# Patient Record
Sex: Male | Born: 1990 | Race: Black or African American | Hispanic: No | Marital: Single | Smoking: Former smoker
Health system: Southern US, Community
[De-identification: ages and names within clinical notes are randomized; demographics above are authoritative.]

## PROBLEM LIST (undated history)

## (undated) DIAGNOSIS — S51811A Laceration without foreign body of right forearm, initial encounter: Secondary | ICD-10-CM

## (undated) DIAGNOSIS — S56921A Laceration of unspecified muscles, fascia and tendons at forearm level, right arm, initial encounter: Secondary | ICD-10-CM

## (undated) DIAGNOSIS — F419 Anxiety disorder, unspecified: Secondary | ICD-10-CM

## (undated) HISTORY — PX: NO PAST SURGERIES: SHX2092

## (undated) HISTORY — PX: ANTERIOR CRUCIATE LIGAMENT REPAIR: SHX115

## (undated) HISTORY — DX: Anxiety disorder, unspecified: F41.9

## (undated) HISTORY — PX: MEDIAL COLLATERAL LIGAMENT AND LATERAL COLLATERAL LIGAMENT REPAIR, ELBOW: SHX2016

---
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
## 2014-04-22 DIAGNOSIS — S51811A Laceration without foreign body of right forearm, initial encounter: Secondary | ICD-10-CM

## 2014-04-22 HISTORY — DX: Laceration without foreign body of right forearm, initial encounter: S51.811A

## 2014-04-23 ENCOUNTER — Encounter (HOSPITAL_COMMUNITY): Payer: Self-pay | Admitting: Emergency Medicine

## 2014-04-23 ENCOUNTER — Emergency Department (HOSPITAL_COMMUNITY): Payer: BC Managed Care – PPO

## 2014-04-23 ENCOUNTER — Emergency Department (HOSPITAL_COMMUNITY)
Admission: EM | Admit: 2014-04-23 | Discharge: 2014-04-23 | Disposition: A | Discharge status: Home / Self Care | Payer: BC Managed Care – PPO | Attending: Emergency Medicine | Admitting: Emergency Medicine

## 2014-04-23 DIAGNOSIS — R4182 Altered mental status, unspecified: Secondary | ICD-10-CM | POA: Diagnosis not present

## 2014-04-23 DIAGNOSIS — S41111A Laceration without foreign body of right upper arm, initial encounter: Secondary | ICD-10-CM | POA: Diagnosis present

## 2014-04-23 DIAGNOSIS — Z880 Allergy status to penicillin: Secondary | ICD-10-CM | POA: Diagnosis not present

## 2014-04-23 DIAGNOSIS — S31010A Laceration without foreign body of lower back and pelvis without penetration into retroperitoneum, initial encounter: Secondary | ICD-10-CM | POA: Diagnosis not present

## 2014-04-23 DIAGNOSIS — Z23 Encounter for immunization: Secondary | ICD-10-CM | POA: Insufficient documentation

## 2014-04-23 DIAGNOSIS — IMO0002 Reserved for concepts with insufficient information to code with codable children: Secondary | ICD-10-CM

## 2014-04-23 DIAGNOSIS — S21111A Laceration without foreign body of right front wall of thorax without penetration into thoracic cavity, initial encounter: Secondary | ICD-10-CM | POA: Insufficient documentation

## 2014-04-23 DIAGNOSIS — Y9389 Activity, other specified: Secondary | ICD-10-CM | POA: Diagnosis not present

## 2014-04-23 DIAGNOSIS — Y929 Unspecified place or not applicable: Secondary | ICD-10-CM | POA: Insufficient documentation

## 2014-04-23 DIAGNOSIS — Y280XXA Contact with sharp glass, undetermined intent, initial encounter: Secondary | ICD-10-CM | POA: Diagnosis not present

## 2014-04-23 DIAGNOSIS — Z720 Tobacco use: Secondary | ICD-10-CM | POA: Insufficient documentation

## 2014-04-23 DIAGNOSIS — T148XXA Other injury of unspecified body region, initial encounter: Secondary | ICD-10-CM

## 2014-04-23 DIAGNOSIS — T07XXXA Unspecified multiple injuries, initial encounter: Secondary | ICD-10-CM

## 2014-04-23 LAB — ETHANOL: Alcohol, Ethyl (B): 63 mg/dL — ABNORMAL HIGH (ref 0–11)

## 2014-04-23 MED ORDER — HYDROCODONE-ACETAMINOPHEN 5-325 MG PO TABS: 1.0000 | ORAL_TABLET | ORAL | Status: DC | PRN

## 2014-04-23 MED ORDER — TETANUS-DIPHTH-ACELL PERTUSSIS 5-2.5-18.5 LF-MCG/0.5 IM SUSP
0.5000 mL | Freq: Once | INTRAMUSCULAR | Status: AC
  Administered 2014-04-23: 0.5 mL via INTRAMUSCULAR
  Filled 2014-04-23: qty 0.5

## 2014-04-23 MED ORDER — SULFAMETHOXAZOLE-TRIMETHOPRIM 800-160 MG PO TABS: 1.0000 | ORAL_TABLET | Freq: Two times a day (BID) | ORAL | Status: AC

## 2014-04-23 MED ORDER — LIDOCAINE-EPINEPHRINE (PF) 2 %-1:200000 IJ SOLN
20.0000 mL | Freq: Once | INTRAMUSCULAR | Status: AC
  Administered 2014-04-23: 20 mL
  Filled 2014-04-23: qty 10

## 2014-04-23 NOTE — ED Notes (Signed)
Irving BurtonEmily, PA at bedside suturing three lacerations.

## 2014-04-23 NOTE — ED Provider Notes (Signed)
Medical screening examination/treatment/procedure(s) were performed by non-physician practitioner and as supervising physician I was immediately available for consultation/collaboration.   Saleema Weppler, MD 04/23/14 0721 

## 2014-04-23 NOTE — ED Provider Notes (Signed)
Patient is a 23 y.o. Male who presents to the ED for lacerations and altered mental status.  Patient likely went through a window and obtained large lacerations to the arm and chest.  Patient was signed out to me at shift change by Trixie DredgeEmily West PA-C.  Patient was to ambulate with a steady gait and to be interviewed for SI.    8:55 am Patient was up and ambulating with a steady gait and was asking to go home.  Patient was able to have a coherent conversation with me and the nursing staff.  Patient does not remember what happened to him.  He reports drinking and taking xanax and possibly smoking marijuana last night.  Patient denies any suicidal ideations or plan.  Patient is stable for discharge at this time.  He is going to find a ride home. Patient to follow-up with Dr. Merrilee SeashoreKuzma's office today.  Patient to return for signs of infection.  Patient states understanding and agreement.  Patient is stable for discharge.    Eben Burowourtney A Forcucci, PA-C 04/23/14 [phone]

## 2014-04-23 NOTE — Discharge Instructions (Signed)
Read the information below.  Use the prescribed medication as directed.  Please discuss all new medications with your pharmacist.  Do not take additional tylenol while taking the prescribed pain medication to avoid overdose.  You may return to the Emergency Department at any time for worsening condition or any new symptoms that concern you.  If you develop redness, swelling, pus draining from the wound, uncontrolled bleeding, or fevers greater than 100.4, return to the ER immediately for a recheck.    It is very important that you call Dr Merrilee SeashoreKuzma's office today to schedule a close follow up appointment.  You are unable to fully extend your ring and pinky fingers on the right and this will likely need to be repaired.   Laceration Care, Adult A laceration is a cut that goes through all layers of the skin. The cut goes into the tissue beneath the skin. HOME CARE For stitches (sutures) or staples:  Keep the cut clean and dry.  If you have a bandage (dressing), change it at least once a day. Change the bandage if it gets wet or dirty, or as told by your doctor.  Wash the cut with soap and water 2 times a day. Rinse the cut with water. Pat it dry with a clean towel.  Put a thin layer of medicated cream on the cut as told by your doctor.  You may shower after the first 24 hours. Do not soak the cut in water until the stitches are removed.  Only take medicines as told by your doctor.  Have your stitches or staples removed as told by your doctor. For skin adhesive strips:  Keep the cut clean and dry.  Do not get the strips wet. You may take a bath, but be careful to keep the cut dry.  If the cut gets wet, pat it dry with a clean towel.  The strips will fall off on their own. Do not remove the strips that are still stuck to the cut. For wound glue:  You may shower or take baths. Do not soak or scrub the cut. Do not swim. Avoid heavy sweating until the glue falls off on its own. After a shower  or bath, pat the cut dry with a clean towel.  Do not put medicine on your cut until the glue falls off.  If you have a bandage, do not put tape over the glue.  Avoid lots of sunlight or tanning lamps until the glue falls off. Put sunscreen on the cut for the first year to reduce your scar.  The glue will fall off on its own. Do not pick at the glue. You may need a tetanus shot if:  You cannot remember when you had your last tetanus shot.  You have never had a tetanus shot. If you need a tetanus shot and you choose not to have one, you may get tetanus. Sickness from tetanus can be serious. GET HELP RIGHT AWAY IF:   Your pain does not get better with medicine.  Your arm, hand, leg, or foot loses feeling (numbness) or changes color.  Your cut is bleeding.  Your joint feels weak, or you cannot use your joint.  You have painful lumps on your body.  Your cut is red, puffy (swollen), or painful.  You have a red line on the skin near the cut.  You have yellowish-white fluid (pus) coming from the cut.  You have a fever.  You have a bad smell coming from  the cut or bandage.  Your cut breaks open before or after stitches are removed.  You notice something coming out of the cut, such as wood or glass.  You cannot move a finger or toe. MAKE SURE YOU:   Understand these instructions.  Will watch your condition.  Will get help right away if you are not doing well or get worse. Document Released: 12/05/2007 Document Revised: 09/10/2011 Document Reviewed: 12/12/2010 [facility] Patient Information 2015 [employer] [place]. This information is not intended to replace advice given to you by your health care provider. Make sure you discuss any questions you have with your health care provider.

## 2014-04-23 NOTE — ED Notes (Signed)
Right radial pulse present.  Patient is able to move right hand without any difficulty.

## 2014-04-23 NOTE — ED Notes (Signed)
Patient transported to X-ray 

## 2014-04-23 NOTE — ED Provider Notes (Signed)
Medical screening examination/treatment/procedure(s) were performed by non-physician practitioner and as supervising physician I was immediately available for consultation/collaboration.   EKG Interpretation None        Megan Docherty, MD 04/23/14 1644 

## 2014-04-23 NOTE — ED Notes (Signed)
[name] BurtonEmily, PA at bedside to suture large laceration.  Patient drowsy, tolerated procedure well.

## 2014-04-23 NOTE — ED Provider Notes (Signed)
CSN: [id number]     Arrival date & time 04/23/14  7829 History   First MD Initiated Contact with Patient 04/23/14 [phone]     Chief Complaint  Patient presents with  . Extremity Laceration     (Consider location/radiation/quality/duration/timing/severity/associated sxs/prior Treatment) The history is provided by the patient and a significant other.    Pt reports he does not know exactly what happened, cut his right arm on glass.  Tells me it was on a car door window.  Denies SI or attempt to self harm.  Significant other states they had talked earlier and she was going to come over to his house after going out, while out he told her that he "fell" and was "bleeding a little."  When she arrived he was beside a broken house window.  Pt states he drank 2 16 oz beers tonight.  Denies weakness or numbness of his right hand, denies other injury.    Level V caveat for intoxication.   History reviewed. No pertinent past medical history. History reviewed. No pertinent past surgical history. History reviewed. No pertinent family history. History  Substance Use Topics  . Smoking status: Current Every Day Smoker  . Smokeless tobacco: Not on file  . Alcohol Use: Yes    Review of Systems  Unable to perform ROS: Mental status change      Allergies  Penicillins and Shrimp  Home Medications   Prior to Admission medications   Not on File   BP 117/62  Pulse 82  Temp(Src) 97.7 F (36.5 C) (Oral)  Resp 16  Ht 5\' 10"  (1.778 m)  Wt 165 lb (74.844 kg)  BMI 23.68 kg/m2  SpO2 98% Physical Exam  Nursing note and vitals reviewed. Constitutional: He appears well-developed and well-nourished. He is easily aroused. No distress.  Drowsy but wakes up to respond to questions.  Appears intoxicated.   HENT:  Head: Normocephalic and atraumatic.  Neck: Neck supple.  Pulmonary/Chest: Effort normal.  Musculoskeletal:       Arms: Right hand with intact distal sensation, capillary refill < 2 seconds.   Full flexion of all digits.  Able to move and partial extend but decreased ability to full extend 4th and 5th fingers.    Neurological: He is alert and easily aroused.  Skin: He is not diaphoretic.    ED Course  Procedures (including critical care time) Labs Review Labs Reviewed  ETHANOL - Abnormal; Notable for the following:    Alcohol, Ethyl (B) 63 (*)    All other components within normal limits    Imaging Review Dg Forearm Right  04/23/2014   CLINICAL DATA:  Patient fell through glass window with a 4 inch laceration on the anterior forearm.  EXAM: RIGHT FOREARM - 2 VIEW  COMPARISON:  None.  FINDINGS: Soft tissue injury to the dorsal aspect of the right forearm with skin defect consistent with history of lacerations. Gauze material is present which may obscure detail but there appears to be a tiny radiopaque foreign body in the soft tissues over the midshaft of the radius, measuring about 5 mm deep to the skin surface. No evidence of acute fracture or dislocation in the radius or ulna.  IMPRESSION: Soft tissue injury to the dorsal mid right forearm with tiny foreign body demonstrated.   Electronically Signed   By: Burman Nieves M.D.   On: 04/23/2014 04:59   Ct Head Wo Contrast  04/23/2014   CLINICAL DATA:  Initial evaluation for acute altered mental status.  EXAM: CT HEAD WITHOUT CONTRAST  TECHNIQUE: Contiguous axial images were obtained from the base of the skull through the vertex without intravenous contrast.  COMPARISON:  None.  FINDINGS: There is no acute intracranial hemorrhage or infarct. No mass lesion or midline shift. Gray-white matter differentiation is well maintained. Ventricles are normal in size without evidence of hydrocephalus. CSF containing spaces are within normal limits. No extra-axial fluid collection.  The calvarium is intact.  Orbital soft tissues are within normal limits.  Mild polypoid mucosal thickening present within the partially visualized maxillary sinuses,  left greater than right. Scattered mucoperiosteal thickening also present within the ethmoidal air cells. No mastoid effusion.  Scalp soft tissues are unremarkable.  IMPRESSION: No acute intracranial process.   Electronically Signed   By: Rise MuBenjamin  McClintock M.D.   On: 04/23/2014 05:16     EKG Interpretation None      5:51 AM Discussed patient with Dr Merlyn LotKuzma.  Recommends wash out and close skin, d/c on bactrim vs doxycyline with follow up in the office.  Pt to call office today for close follow up appointment early next week.    LACERATION REPAIR Performed by: Trixie DredgeWEST, Keiona Jenison Authorized by: Trixie DredgeWEST, Niya Behler Consent: Verbal consent obtained. Risks and benefits: risks, benefits and alternatives were discussed Consent given by: patient Patient identity confirmed: provided demographic data Prepped and Draped in normal sterile fashion Wound explored  Laceration Location: right upper arm  Laceration Length: 2cm  No Foreign Bodies seen or palpated  Anesthesia: local infiltration  Local anesthetic: lidocaine 2% with epinephrine  Anesthetic total: 1 ml  Irrigation method: syringe Amount of cleaning: standard  Skin closure: 4-0 prolene  Number of sutures: 3  Technique: simple interrupted  Patient tolerance: Patient tolerated the procedure well with no immediate complications.   LACERATION REPAIR Performed by: Trixie DredgeWEST, Atlas Crossland Authorized by: Trixie DredgeWEST, Taveon Enyeart Consent: Verbal consent obtained. Risks and benefits: risks, benefits and alternatives were discussed Consent given by: patient Patient identity confirmed: provided demographic data Prepped and Draped in normal sterile fashion Wound explored  Laceration Location: right chest wall  Laceration Length: 7cm  No Foreign Bodies seen or palpated  Anesthesia: local infiltration  Local anesthetic: lidocaine 2% with epinephrine  Anesthetic total: 3 ml  Irrigation method: syringe Amount of cleaning: standard  Skin closure: 4-0  prolene  Number of sutures: 6  Technique: simple interrupted  Patient tolerance: Patient tolerated the procedure well with no immediate complications.   LACERATION REPAIR Performed by: Trixie DredgeWEST, Marbeth Smedley Authorized by: Trixie DredgeWEST, Sakeenah Valcarcel Consent: Verbal consent obtained. Risks and benefits: risks, benefits and alternatives were discussed Consent given by: patient Patient identity confirmed: provided demographic data Prepped and Draped in normal sterile fashion Wound explored  Laceration Location: left upper back  Laceration Length: 1.5cm  No Foreign Bodies seen or palpated  Anesthesia: local infiltration  Local anesthetic: lidocaine 2% with epinephrine  Anesthetic total: 1 ml  Irrigation method: syringe Amount of cleaning: standard  Skin closure: 4-0 prolene  Number of sutures: 2  Technique: simple interrupted  Patient tolerance: Patient tolerated the procedure well with no immediate complications.   LACERATION REPAIR Performed by: Trixie DredgeWEST, Ercel Pepitone Authorized by: Trixie DredgeWEST, Midge Momon Consent: Verbal consent obtained. Risks and benefits: risks, benefits and alternatives were discussed Consent given by: patient Patient identity confirmed: provided demographic data Prepped and Draped in normal sterile fashion Wound explored  Laceration Location: right forearm, dorsal  Laceration Length: 10cm  No Foreign Bodies seen or palpated  Anesthesia: local infiltration  Local anesthetic: lidocaine 2% with epinephrine  Anesthetic total: 4 ml  Irrigation method: syringe Amount of cleaning: extensive  Skin closure: 4-0 prolene  Number of sutures: 5  Technique: horizontal mattress  Patient tolerance: Patient tolerated the procedure well with no immediate complications.   MDM   Final diagnoses:  Altered mental status  Laceration  Multiple lacerations  Muscle laceration   Pt with multiple simple lacerations to right chest wall and right upper arm and large deep laceration to  proximal forearm.  Pt with decreased strength and ability to extend 4th and 5th digits on right hand.  Pt appearing intoxicated and sleepy and unable to tell me how injury happened, therefore ordered CT head negative.  Extremity film shows possible foreign body.  ETOH 63. Discussed pt with hand surgery, please see discussion above.   7:05 AM Patient signed out to [facility]ney Forcucci, PA-C at change of shift.  Pt will need to sober somewhat before leaving and become clear enough to ensure that he did not do this purposely and that there are no other injuries.      Harpers FerryEmily Thurmon Mizell, PA-C 04/23/14 [phone]

## 2014-04-23 NOTE — ED Notes (Signed)
Pt has a laceration noted to his right forearm about 4 inches in length with muscle protruding  Pt states he is unsure how it happened but he thinks he fell into his window  Pt states he has been drinking tonight  Pt is able to move all his fingers on his right hand

## 2014-04-26 ENCOUNTER — Emergency Department (HOSPITAL_COMMUNITY)
Admission: EM | Admit: 2014-04-26 | Discharge: 2014-04-26 | Disposition: A | Discharge status: Home / Self Care | Payer: BC Managed Care – PPO | Attending: Emergency Medicine | Admitting: Emergency Medicine

## 2014-04-26 ENCOUNTER — Encounter (HOSPITAL_COMMUNITY): Payer: Self-pay | Admitting: Emergency Medicine

## 2014-04-26 DIAGNOSIS — Z4801 Encounter for change or removal of surgical wound dressing: Secondary | ICD-10-CM | POA: Diagnosis not present

## 2014-04-26 DIAGNOSIS — Z880 Allergy status to penicillin: Secondary | ICD-10-CM | POA: Insufficient documentation

## 2014-04-26 DIAGNOSIS — Z792 Long term (current) use of antibiotics: Secondary | ICD-10-CM | POA: Diagnosis not present

## 2014-04-26 DIAGNOSIS — R531 Weakness: Secondary | ICD-10-CM | POA: Diagnosis not present

## 2014-04-26 DIAGNOSIS — Z5189 Encounter for other specified aftercare: Secondary | ICD-10-CM

## 2014-04-26 DIAGNOSIS — Z720 Tobacco use: Secondary | ICD-10-CM | POA: Diagnosis not present

## 2014-04-26 NOTE — Discharge Instructions (Signed)
Read the information below.  You may return to the Emergency Department at any time for worsening condition or any new symptoms that concern you.  It is very important that you follow up with the hand surgeon listed above.  Call his office today for an appointment.  This is because your wound has made you unable to fully extend your 4th and 5th fingers.  If you develop redness, swelling, pus draining from the wound, or fevers greater than 100.4, return to the ER immediately for a recheck.

## 2014-04-26 NOTE — ED Provider Notes (Signed)
Medical screening examination/treatment/procedure(s) were performed by non-physician practitioner and as supervising physician I was immediately available for consultation/collaboration.   EKG Interpretation None       Katalyna Socarras, MD 04/26/14 1731 

## 2014-04-26 NOTE — ED Notes (Signed)
Pt has multiple lacerations from 10/23. Sutures intact to right forearm, right chest and left back.

## 2014-04-26 NOTE — ED Notes (Signed)
Pt reports he needs sutures removed from arm; has not taken original bandage off arm

## 2014-04-26 NOTE — ED Provider Notes (Signed)
CSN: [id number]     Arrival date & time 04/26/14  1042 History  This chart was scribed for non-physician practitioner working with Doug SouSam Jacubowitz, MD by Leone PayorSonum Patel, ED Scribe. This patient was seen in room TR07C/TR07C and the patient's care was started at 12:23 PM.    Chief Complaint  Patient presents with  . Suture / Staple Removal    The history is provided by the patient. No language interpreter was used.    HPI Comments: [name] [name] is a 23 y.o. male who presents to the Emergency Department requesting wound check today. Patient was seen on 04/23/14 for lacerations. The mechanism of injury was not clear at that time due patient intoxication. Patient states he is still not able to recall the events of that night. He states he has not followed up with hand surgeon but is taking the prescribed Bactrim. He has been keeping the affected areas covered. He denies associated fever, drainage, discharge. He denies current pain.   History reviewed. No pertinent past medical history. History reviewed. No pertinent past surgical history. History reviewed. No pertinent family history. History  Substance Use Topics  . Smoking status: Current Every Day Smoker  . Smokeless tobacco: Not on file  . Alcohol Use: Yes    Review of Systems  Constitutional: Negative for fever.  Skin: Positive for wound.  Neurological: Positive for weakness.    Allergies  Penicillins and Shrimp  Home Medications   Prior to Admission medications   Medication Sig Start Date End Date Taking? Authorizing Provider  HYDROcodone-acetaminophen (NORCO/VICODIN) 5-325 MG per tablet Take 1-2 tablets by mouth every 4 (four) hours as needed for moderate pain or severe pain. 04/23/14   Trixie DredgeEmily Charliegh Vasudevan, PA-C  sulfamethoxazole-trimethoprim (BACTRIM DS,SEPTRA DS) 800-160 MG per tablet Take 1 tablet by mouth 2 (two) times daily. 04/23/14 04/30/14  Trixie DredgeEmily Raymona Boss, PA-C   BP 137/84  Pulse 91  Temp(Src) 97.5 F (36.4 C) (Oral)  Resp 20   Ht 5\' 10"  (1.778 m)  Wt 165 lb (74.844 kg)  BMI 23.68 kg/m2  SpO2 100% Physical Exam  Nursing note and vitals reviewed. Constitutional: He appears well-developed and well-nourished. No distress.  HENT:  Head: Normocephalic and atraumatic.  Neck: Neck supple.  Pulmonary/Chest: Effort normal.  Neurological: He is alert.  Skin: He is not diaphoretic.  Sutures in place over left upper back, right chest wall, right upper arm; no erythema, edema, warmth, tenderness, discharge. Right proximal forearm with sutures intact; no erythema, edema, warmth, tenderness, discharge. Distal sensation and pulses intact. Continued weakness with extension of 4th and 5th digits.     ED Course  Procedures (including critical care time)  DIAGNOSTIC STUDIES: Oxygen Saturation is 100% on RA, normal by my interpretation.    COORDINATION OF CARE: 12:29 PM Discussed treatment plan with pt at bedside and pt agreed to plan.   Labs Review Labs Reviewed - No data to display  Imaging Review No results found.   EKG Interpretation None      MDM   Final diagnoses:  None   Afebrile, nontoxic patient with multiple lacerations from incident he does not remember 10/23.  I saw him at that time as well.  Most of the lacerations were small and uncomplicated, are currently healing well.  Right forearm laceration was large and gaping, complex, with concern as pt is unable to extend 4th and 5th fingers.  I spoke with Dr Merlyn LotKuzma at the time of the accident.  I have encouraged patient to continue his  antibiotics and follow up with Dr Merlyn LotKuzma.   No e/o wound infection.  D/C home with wound care instructions, hand follow up.  Discussed result, findings, treatment, and follow up  with patient.  Pt given return precautions.  Pt verbalizes understanding and agrees with plan.       I personally performed the services described in this documentation, which was scribed in my presence. The recorded information has been reviewed and is  accurate.   Trixie Dredgemily Shuntay Everetts, PA-C 04/26/14 1336

## 2014-04-30 ENCOUNTER — Other Ambulatory Visit: Payer: Self-pay | Admitting: Orthopedic Surgery

## 2014-05-03 ENCOUNTER — Encounter (HOSPITAL_BASED_OUTPATIENT_CLINIC_OR_DEPARTMENT_OTHER): Payer: Self-pay | Admitting: *Deleted

## 2014-05-04 ENCOUNTER — Ambulatory Visit (HOSPITAL_BASED_OUTPATIENT_CLINIC_OR_DEPARTMENT_OTHER)
Admission: RE | Admit: 2014-05-04 | Discharge: 2014-05-04 | Disposition: A | Discharge status: Home / Self Care | Payer: BC Managed Care – PPO | Source: Ambulatory Visit | Attending: Orthopedic Surgery | Admitting: Orthopedic Surgery

## 2014-05-04 ENCOUNTER — Encounter (HOSPITAL_BASED_OUTPATIENT_CLINIC_OR_DEPARTMENT_OTHER): Payer: Self-pay | Admitting: Anesthesiology

## 2014-05-04 ENCOUNTER — Ambulatory Visit (HOSPITAL_BASED_OUTPATIENT_CLINIC_OR_DEPARTMENT_OTHER): Payer: BC Managed Care – PPO | Admitting: Anesthesiology

## 2014-05-04 ENCOUNTER — Encounter (HOSPITAL_BASED_OUTPATIENT_CLINIC_OR_DEPARTMENT_OTHER)
Admission: RE | Disposition: A | Discharge status: Home / Self Care | Payer: Self-pay | Source: Ambulatory Visit | Attending: Orthopedic Surgery

## 2014-05-04 DIAGNOSIS — Z880 Allergy status to penicillin: Secondary | ICD-10-CM | POA: Diagnosis not present

## 2014-05-04 DIAGNOSIS — Y929 Unspecified place or not applicable: Secondary | ICD-10-CM | POA: Diagnosis not present

## 2014-05-04 DIAGNOSIS — X58XXXA Exposure to other specified factors, initial encounter: Secondary | ICD-10-CM | POA: Insufficient documentation

## 2014-05-04 DIAGNOSIS — S5411XA Injury of median nerve at forearm level, right arm, initial encounter: Secondary | ICD-10-CM | POA: Diagnosis not present

## 2014-05-04 DIAGNOSIS — Z91013 Allergy to seafood: Secondary | ICD-10-CM | POA: Diagnosis not present

## 2014-05-04 DIAGNOSIS — F17210 Nicotine dependence, cigarettes, uncomplicated: Secondary | ICD-10-CM | POA: Insufficient documentation

## 2014-05-04 HISTORY — DX: Laceration without foreign body of right forearm, initial encounter: S51.811A

## 2014-05-04 HISTORY — PX: NERVE REPAIR: SHX2083

## 2014-05-04 HISTORY — PX: WOUND EXPLORATION: SHX6188

## 2014-05-04 HISTORY — DX: Laceration of unspecified muscles, fascia and tendons at forearm level, right arm, initial encounter: S56.921A

## 2014-05-04 SURGERY — WOUND EXPLORATION
Anesthesia: Regional | Site: Arm Lower | Laterality: Right

## 2014-05-04 MED ORDER — FENTANYL CITRATE 0.05 MG/ML IJ SOLN
INTRAMUSCULAR | Status: AC
  Filled 2014-05-04: qty 4

## 2014-05-04 MED ORDER — OXYCODONE HCL 5 MG PO TABS: 5.0000 mg | ORAL_TABLET | Freq: Once | ORAL | Status: DC | PRN

## 2014-05-04 MED ORDER — FENTANYL CITRATE 0.05 MG/ML IJ SOLN
INTRAMUSCULAR | Status: AC
  Filled 2014-05-04: qty 2

## 2014-05-04 MED ORDER — MIDAZOLAM HCL 2 MG/2ML IJ SOLN
1.0000 mg | INTRAMUSCULAR | Status: DC | PRN
  Administered 2014-05-04: 2 mg via INTRAVENOUS

## 2014-05-04 MED ORDER — HYDROCODONE-ACETAMINOPHEN 5-325 MG PO TABS: ORAL_TABLET | ORAL | Status: DC

## 2014-05-04 MED ORDER — MIDAZOLAM HCL 2 MG/2ML IJ SOLN
INTRAMUSCULAR | Status: AC
  Filled 2014-05-04: qty 2

## 2014-05-04 MED ORDER — ONDANSETRON HCL 4 MG/2ML IJ SOLN: 4.0000 mg | Freq: Four times a day (QID) | INTRAMUSCULAR | Status: DC | PRN

## 2014-05-04 MED ORDER — CHLORHEXIDINE GLUCONATE 4 % EX LIQD: 60.0000 mL | Freq: Once | CUTANEOUS | Status: DC

## 2014-05-04 MED ORDER — OXYCODONE HCL 5 MG/5ML PO SOLN: 5.0000 mg | Freq: Once | ORAL | Status: DC | PRN

## 2014-05-04 MED ORDER — ONDANSETRON HCL 4 MG/2ML IJ SOLN
INTRAMUSCULAR | Status: DC | PRN
  Administered 2014-05-04: 4 mg via INTRAVENOUS

## 2014-05-04 MED ORDER — VANCOMYCIN HCL IN DEXTROSE 1-5 GM/200ML-% IV SOLN
INTRAVENOUS | Status: AC
  Filled 2014-05-04: qty 200

## 2014-05-04 MED ORDER — LACTATED RINGERS IV SOLN
INTRAVENOUS | Status: DC | PRN
  Administered 2014-05-04 (×2): via INTRAVENOUS

## 2014-05-04 MED ORDER — LACTATED RINGERS IV SOLN
INTRAVENOUS | Status: DC
  Administered 2014-05-04: 14:00:00 via INTRAVENOUS

## 2014-05-04 MED ORDER — LIDOCAINE HCL (CARDIAC) 20 MG/ML IV SOLN
INTRAVENOUS | Status: DC | PRN
  Administered 2014-05-04: 100 mg via INTRAVENOUS

## 2014-05-04 MED ORDER — DEXAMETHASONE SODIUM PHOSPHATE 4 MG/ML IJ SOLN
INTRAMUSCULAR | Status: DC | PRN
  Administered 2014-05-04: 10 mg via INTRAVENOUS

## 2014-05-04 MED ORDER — PROPOFOL 10 MG/ML IV BOLUS
INTRAVENOUS | Status: DC | PRN
  Administered 2014-05-04: 200 mg via INTRAVENOUS

## 2014-05-04 MED ORDER — MIDAZOLAM HCL 2 MG/ML PO SYRP: 12.0000 mg | ORAL_SOLUTION | Freq: Once | ORAL | Status: DC | PRN

## 2014-05-04 MED ORDER — HYDROMORPHONE HCL 1 MG/ML IJ SOLN: 0.2500 mg | INTRAMUSCULAR | Status: DC | PRN

## 2014-05-04 MED ORDER — BUPIVACAINE-EPINEPHRINE (PF) 0.5% -1:200000 IJ SOLN
INTRAMUSCULAR | Status: DC | PRN
  Administered 2014-05-04: 30 mL via PERINEURAL

## 2014-05-04 MED ORDER — FENTANYL CITRATE 0.05 MG/ML IJ SOLN
50.0000 ug | INTRAMUSCULAR | Status: DC | PRN
  Administered 2014-05-04: 100 ug via INTRAVENOUS

## 2014-05-04 MED ORDER — VANCOMYCIN HCL IN DEXTROSE 1-5 GM/200ML-% IV SOLN
1000.0000 mg | INTRAVENOUS | Status: AC
  Administered 2014-05-04: 1000 mg via INTRAVENOUS

## 2014-05-04 SURGICAL SUPPLY — 68 items
BAG DECANTER FOR FLEXI CONT (MISCELLANEOUS) IMPLANT
BANDAGE ELASTIC 3 VELCRO ST LF (GAUZE/BANDAGES/DRESSINGS) ×4 IMPLANT
BLADE MINI RND TIP GREEN BEAV (BLADE) IMPLANT
BLADE SURG 15 STRL LF DISP TIS (BLADE) ×4 IMPLANT
BLADE SURG 15 STRL SS (BLADE) ×8
BNDG CMPR 9X4 STRL LF SNTH (GAUZE/BANDAGES/DRESSINGS) ×2
BNDG ESMARK 4X9 LF (GAUZE/BANDAGES/DRESSINGS) ×3 IMPLANT
BNDG GAUZE ELAST 4 BULKY (GAUZE/BANDAGES/DRESSINGS) ×4 IMPLANT
CHLORAPREP W/TINT 26ML (MISCELLANEOUS) ×1 IMPLANT
CORDS BIPOLAR (ELECTRODE) ×4 IMPLANT
COVER BACK TABLE 60X90IN (DRAPES) ×4 IMPLANT
COVER MAYO STAND STRL (DRAPES) ×4 IMPLANT
CUFF TOURNIQUET SINGLE 18IN (TOURNIQUET CUFF) IMPLANT
DECANTER SPIKE VIAL GLASS SM (MISCELLANEOUS) ×4 IMPLANT
DRAPE EXTREMITY T 121X128X90 (DRAPE) ×4 IMPLANT
DRAPE SURG 17X23 STRL (DRAPES) ×4 IMPLANT
GAUZE SPONGE 4X4 12PLY STRL (GAUZE/BANDAGES/DRESSINGS) ×4 IMPLANT
GAUZE XEROFORM 1X8 LF (GAUZE/BANDAGES/DRESSINGS) ×4 IMPLANT
GLOVE BIO SURGEON STRL SZ 6.5 (GLOVE) ×2 IMPLANT
GLOVE BIO SURGEON STRL SZ7.5 (GLOVE) ×4 IMPLANT
GLOVE BIO SURGEONS STRL SZ 6.5 (GLOVE) ×1
GLOVE BIOGEL PI IND STRL 7.0 (GLOVE) ×1 IMPLANT
GLOVE BIOGEL PI IND STRL 8 (GLOVE) ×2 IMPLANT
GLOVE BIOGEL PI IND STRL 8.5 (GLOVE) ×1 IMPLANT
GLOVE BIOGEL PI INDICATOR 7.0 (GLOVE) ×2
GLOVE BIOGEL PI INDICATOR 8 (GLOVE) ×2
GLOVE BIOGEL PI INDICATOR 8.5 (GLOVE) ×2
GLOVE SURG ORTHO 8.0 STRL STRW (GLOVE) ×3 IMPLANT
GOWN STRL REUS W/ TWL LRG LVL3 (GOWN DISPOSABLE) ×2 IMPLANT
GOWN STRL REUS W/TWL LRG LVL3 (GOWN DISPOSABLE) ×4
GOWN STRL REUS W/TWL XL LVL3 (GOWN DISPOSABLE) ×7 IMPLANT
LOCATOR NERVE 3 VOLT (DISPOSABLE) ×3 IMPLANT
LOOP VESSEL MAXI BLUE (MISCELLANEOUS) IMPLANT
NDL HYPO 25X1 1.5 SAFETY (NEEDLE) IMPLANT
NDL SAFETY ECLIPSE 18X1.5 (NEEDLE) IMPLANT
NEEDLE HYPO 18GX1.5 SHARP (NEEDLE)
NEEDLE HYPO 25X1 1.5 SAFETY (NEEDLE) IMPLANT
NS IRRIG 1000ML POUR BTL (IV SOLUTION) ×4 IMPLANT
PACK BASIN DAY SURGERY FS (CUSTOM PROCEDURE TRAY) ×4 IMPLANT
PAD CAST 3X4 CTTN HI CHSV (CAST SUPPLIES) ×2 IMPLANT
PAD CAST 4YDX4 CTTN HI CHSV (CAST SUPPLIES) ×1 IMPLANT
PADDING CAST ABS 4INX4YD NS (CAST SUPPLIES)
PADDING CAST ABS COTTON 4X4 ST (CAST SUPPLIES) ×1 IMPLANT
PADDING CAST COTTON 3X4 STRL (CAST SUPPLIES) ×4
PADDING CAST COTTON 4X4 STRL (CAST SUPPLIES) ×4
SLEEVE SCD COMPRESS KNEE MED (MISCELLANEOUS) ×3 IMPLANT
SLING ARM IMMOBILIZER LRG (SOFTGOODS) ×3 IMPLANT
SPEAR EYE SURG WECK-CEL (MISCELLANEOUS) ×4 IMPLANT
SPLINT PLASTER CAST XFAST 3X15 (CAST SUPPLIES) ×10 IMPLANT
SPLINT PLASTER XTRA FASTSET 3X (CAST SUPPLIES) ×20
STOCKINETTE 4X48 STRL (DRAPES) ×4 IMPLANT
SUT ETHIBOND 3-0 V-5 (SUTURE) IMPLANT
SUT ETHILON 3 0 PS 1 (SUTURE) ×3 IMPLANT
SUT ETHILON 4 0 PS 2 18 (SUTURE) ×4 IMPLANT
SUT ETHILON 8 0 BV130 4 (SUTURE) ×3 IMPLANT
SUT FIBERWIRE 4-0 18 TAPR NDL (SUTURE)
SUT MERSILENE 6 0 P 1 (SUTURE) IMPLANT
SUT NYLON 9 0 VRM6 (SUTURE) IMPLANT
SUT PROLENE 6 0 P 1 18 (SUTURE) IMPLANT
SUT SILK 4 0 PS 2 (SUTURE) IMPLANT
SUT SUPRAMID 4-0 (SUTURE) IMPLANT
SUT VICRYL 3-0 RB1 (SUTURE) ×3 IMPLANT
SUT VICRYL 4-0 PS2 18IN ABS (SUTURE) IMPLANT
SUTURE FIBERWR 4-0 18 TAPR NDL (SUTURE) IMPLANT
SYR BULB 3OZ (MISCELLANEOUS) ×4 IMPLANT
SYR CONTROL 10ML LL (SYRINGE) IMPLANT
TOWEL OR 17X24 6PK STRL BLUE (TOWEL DISPOSABLE) ×8 IMPLANT
UNDERPAD 30X30 INCONTINENT (UNDERPADS AND DIAPERS) ×4 IMPLANT

## 2014-05-04 NOTE — Anesthesia Procedure Notes (Addendum)
Procedure Name: LMA Insertion Date/Time: 05/04/2014 2:07 PM Performed by: Caren MacadamARTER, APRIL W Pre-anesthesia Checklist: Patient identified, Emergency Drugs available, Suction available and Patient being monitored Patient Re-evaluated:Patient Re-evaluated prior to inductionOxygen Delivery Method: Circle System Utilized Preoxygenation: Pre-oxygenation with 100% oxygen Intubation Type: IV induction Ventilation: Mask ventilation without difficulty LMA: LMA inserted LMA Size: 5.0 Number of attempts: 1 Airway Equipment and Method: bite block Placement Confirmation: positive ETCO2 and breath sounds checked- equal and bilateral Tube secured with: Tape Dental Injury: Teeth and Oropharynx as per pre-operative assessment    Anesthesia Regional Block:  Supraclavicular block  Pre-Anesthetic Checklist: ,, timeout performed, Correct Patient, Correct Site, Correct Laterality, Correct Procedure, Correct Position, site marked, Risks and benefits discussed,  Surgical consent,  Pre-op evaluation,  At surgeon's request and post-op pain management  Laterality: Right  Prep: chloraprep       Needles:  Injection technique: Single-shot  Needle Type: Echogenic Stimulator Needle     Needle Length: 9cm 9 cm Needle Gauge: 21 and 21 G    Additional Needles:  Procedures: ultrasound guided (picture in chart) and nerve stimulator Supraclavicular block  Nerve Stimulator or Paresthesia:  Response: biceps flexion, 0.45 mA,   Additional Responses:   Narrative:  Start time: 05/04/2014 1:23 PM End time: 05/04/2014 1:33 PM Injection made incrementally with aspirations every 5 mL.  Performed by: Personally   Additional Notes: Functioning IV was confirmed and monitors were applied.  A 50mm 22ga Arrow echogenic stimulator needle was used. Sterile prep and drape,hand hygiene and sterile gloves were used.  Negative aspiration and negative test dose prior to incremental administration of local anesthetic. The patient  tolerated the procedure well.  Ultrasound guidance: relevent anatomy identified, needle position confirmed, local anesthetic spread visualized around nerve(s), vascular puncture avoided.  Image printed for medical record.

## 2014-05-04 NOTE — Brief Op Note (Signed)
05/04/2014  3:24 PM  PATIENT:  Carlyle DollyLonnie Kobus  23 y.o. male  PRE-OPERATIVE DIAGNOSIS:  right forearm laceration  POST-OPERATIVE DIAGNOSIS:  Right Forearm Laceration with PIN branch to EDC laceration  PROCEDURE:  Procedure(s): RIGHT FOREARM WOUND EXPLORATION (Right) NERVE REPAIR (Right)  SURGEON:  Surgeon(s) and Role:    * Betha LoaKevin Jerra Huckeby, MD - Primary    * Cindee SaltGary Suvi Archuletta, MD - Assisting  PHYSICIAN ASSISTANT:   ASSISTANTS: Cindee SaltGary Tametria Aho, MD   ANESTHESIA:   regional and general  EBL:  Total I/O In: 1400 [I.V.:1400] Out: -   BLOOD ADMINISTERED:none  DRAINS: none   LOCAL MEDICATIONS USED:  NONE  SPECIMEN:  No Specimen  DISPOSITION OF SPECIMEN:  N/A  COUNTS:  YES  TOURNIQUET:   Total Tourniquet Time Documented: Upper Arm (Right) - 57 minutes Total: Upper Arm (Right) - 57 minutes   DICTATION: .Other Dictation: Dictation Number [id number]  PLAN OF CARE: Discharge to home after PACU  PATIENT DISPOSITION:  PACU - hemodynamically stable.

## 2014-05-04 NOTE — Transfer of Care (Signed)
Immediate Anesthesia Transfer of Care Note  Patient: [name] [name]  Procedure(s) Performed: Procedure(s): RIGHT FOREARM WOUND EXPLORATION (Right) NERVE REPAIR (Right)  Patient Location: PACU  Anesthesia Type:General and Regional  Level of Consciousness: awake, alert  and oriented  Airway & Oxygen Therapy: Patient Spontanous Breathing and Patient connected to face mask oxygen  Post-op Assessment: Report given to PACU RN and Post -op Vital signs reviewed and stable  Post vital signs: Reviewed and stable  Complications: No apparent anesthesia complications

## 2014-05-04 NOTE — Progress Notes (Signed)
Assisted Dr. Hodierne with right, ultrasound guided, supraclavicular block. Side rails up, monitors on throughout procedure. See vital signs in flow sheet. Tolerated Procedure well.  

## 2014-05-04 NOTE — Discharge Instructions (Addendum)
[facility] Instructions Hand Surgery  Wound Care: Keep your hand elevated above the level of your heart.  Do not allow it to dangle by your side.  Keep the dressing dry and do not remove it unless your doctor advises you to do so.  He will usually change it at the time of your post-op visit.  Moving your fingers is advised to stimulate circulation but will depend on the site of your surgery.  If you have a splint applied, your doctor will advise you regarding movement.  Activity: Do not drive or operate machinery today.  Rest today and then you may return to your normal activity and work as indicated by your physician.  Diet:  Drink liquids today or eat a light diet.  You may resume a regular diet tomorrow.    General expectations: Pain for two to three days. Fingers may become slightly swollen.  Call your doctor if any of the following occur: Severe pain not relieved by pain medication. Elevated temperature. Dressing soaked with blood. Inability to move fingers. White or bluish color to fingers.     Post Anesthesia Home Care Instructions  Activity: Get plenty of rest for the remainder of the day. A responsible adult should stay with you for 24 hours following the procedure.  For the next 24 hours, DO NOT: -Drive a car -Advertising copywriterperate machinery -Drink alcoholic beverages -Take any medication unless instructed by your physician -Make any legal decisions or sign important papers.  Meals: Start with liquid foods such as gelatin or soup. Progress to regular foods as tolerated. Avoid greasy, spicy, heavy foods. If nausea and/or vomiting occur, drink only clear liquids until the nausea and/or vomiting subsides. Call your physician if vomiting continues.  Special Instructions/Symptoms: Your throat may feel dry or sore from the anesthesia or the breathing tube placed in your throat during surgery. If this causes discomfort, gargle with warm salt water. The discomfort should disappear  within 24 hours.   Call your surgeon if you experience:   1.  Fever over 101.0. 2.  Inability to urinate. 3.  Nausea and/or vomiting. 4.  Extreme swelling or bruising at the surgical site. 5.  Continued bleeding from the incision. 6.  Increased pain, redness or drainage from the incision. 7.  Problems related to your pain medication. 8. Any change in color, movement and/or sensation 9. Any problems and/or concerns   Regional Anesthesia Blocks  1. Numbness or the inability to move the "blocked" extremity may last from 3-48 hours after placement. The length of time depends on the medication injected and your individual response to the medication. If the numbness is not going away after 48 hours, call your surgeon.  2. The extremity that is blocked will need to be protected until the numbness is gone and the  Strength has returned. Because you cannot feel it, you will need to take extra care to avoid injury. Because it may be weak, you may have difficulty moving it or using it. You may not know what position it is in without looking at it while the block is in effect.  3. For blocks in the legs and feet, returning to weight bearing and walking needs to be done carefully. You will need to wait until the numbness is entirely gone and the strength has returned. You should be able to move your leg and foot normally before you try and bear weight or walk. You will need someone to be with you when you first try to  ensure you do not fall and possibly risk injury.  4. Bruising and tenderness at the needle site are common side effects and will resolve in a few days.  5. Persistent numbness or new problems with movement should be communicated to the surgeon or the [facility] [phone] [place] [phone]).

## 2014-05-04 NOTE — Anesthesia Postprocedure Evaluation (Signed)
  Anesthesia Post-op Note  Patient: [name] [name]  Procedure(s) Performed: Procedure(s): RIGHT FOREARM WOUND EXPLORATION (Right) NERVE REPAIR (Right)  Patient Location: PACU  Anesthesia Type:GA combined with regional for post-op pain  Level of Consciousness: awake, alert , oriented and patient cooperative  Airway and Oxygen Therapy: Patient Spontanous Breathing  Post-op Pain: none  Post-op Assessment: Post-op Vital signs reviewed, Patient's Cardiovascular Status Stable, Respiratory Function Stable, Patent Airway, No signs of Nausea or vomiting and Pain level controlled  Post-op Vital Signs: Reviewed and stable  Last Vitals:  Filed Vitals:   05/04/14 1620  BP: 127/80  Pulse: 68  Temp: 36.6 C  Resp:     Complications: No apparent anesthesia complications

## 2014-05-04 NOTE — Anesthesia Preprocedure Evaluation (Signed)
Anesthesia Evaluation  Patient identified by MRN, date of birth, ID band Patient awake    Reviewed: Allergy & Precautions, H&P , NPO status , Patient's Chart, lab work & pertinent test results  Airway Mallampati: II   Neck ROM: full    Dental   Pulmonary Current Smoker,          Cardiovascular negative cardio ROS      Neuro/Psych    GI/Hepatic   Endo/Other    Renal/GU      Musculoskeletal   Abdominal   Peds  Hematology   Anesthesia Other Findings   Reproductive/Obstetrics                             Anesthesia Physical Anesthesia Plan  ASA: I  Anesthesia Plan: General and Regional   Post-op Pain Management: MAC Combined w/ Regional for Post-op pain   Induction: Intravenous  Airway Management Planned: LMA  Additional Equipment:   Intra-op Plan:   Post-operative Plan:   Informed Consent: I have reviewed the patients History and Physical, chart, labs and discussed the procedure including the risks, benefits and alternatives for the proposed anesthesia with the patient or authorized representative who has indicated his/her understanding and acceptance.     Plan Discussed with: CRNA, Anesthesiologist and Surgeon  Anesthesia Plan Comments:         Anesthesia Quick Evaluation

## 2014-05-04 NOTE — H&P (Signed)
  [name] [name] is an 23 y.o. male.   Chief Complaint: right forearm laceration HPI: 23 yo rhd male states he lacerated right proximal forearm when he fell into a window 04/22/14.  [facility] and sut[facility].  Followed up in office.  Reports no previous injury to right arm.  Past Medical History  Diagnosis Date  . Laceration of right forearm with tendon involvement 04/22/2014    Past Surgical History  Procedure Laterality Date  . No past surgeries      History reviewed. No pertinent family history. Social History:  reports that he has been smoking Cigarettes.  He has been smoking about 0.00 packs per day for the past 2 years. He has never used smokeless tobacco. He reports that he drinks alcohol. He reports that he does not use illicit drugs.  Allergies:  Allergies  Allergen Reactions  . Shrimp [Shellfish Allergy] Swelling    SWELLING OF EYES  . Penicillins Rash    No prescriptions prior to admission    No results found for this or any previous visit (from the past 48 hour(s)).  No results found.   A comprehensive review of systems was negative except for: Eyes: positive for contacts/glasses  Height 5\' 10"  (1.778 m), weight 74.844 kg (165 lb).  General appearance: alert, cooperative and appears stated age Head: Normocephalic, without obvious abnormality, atraumatic Neck: supple, symmetrical, trachea midline Resp: clear to auscultation bilaterally Cardio: regular rate and rhythm GI: non tender Extremities: intact sensation and capillary refill all digits.  +epl/fpl/io.  difficutly with extension of small and ring fingers on right.  good wrist extension.  laceration at proximal radial aspect of dorsal forearm.  no erythema. Pulses: 2+ and symmetric Skin: Skin color, texture, turgor normal. No rashes or lesions Neurologic: Grossly normal Incision/Wound:  as above  Assessment/Plan Right forearm laceration.  Recommend OR for exploration and repair as  necessary.  Non operative and operative treatment options were discussed with the patient and patient wishes to proceed with operative treatment. Risks, benefits, and alternatives of surgery were discussed and the patient agrees with the plan of care.   Izabellah Dadisman R 05/04/2014, 11:12 AM

## 2014-05-04 NOTE — Op Note (Signed)
[id number] 

## 2014-05-05 LAB — POCT HEMOGLOBIN-HEMACUE: Hemoglobin: 15.5 g/dL (ref 13.0–17.0)

## 2014-05-05 NOTE — Op Note (Signed)
NAMClydia [name]:  [name] [name]              ACCOUNT NO.:  [id number]  MEDICAL RECORD NO.:  [id number]  LOCATION:                                 FACILITY:  PHYSICIAN:  [name] LoaKevin Aleaya Latona, MD        DATE OF BIRTH:  04-17-1991  DATE OF PROCEDURE:  05/04/2014 DATE OF DISCHARGE:                              OPERATIVE REPORT   PREOPERATIVE DIAGNOSIS:  Right forearm laceration with possible muscle laceration versus posterior interosseous nerve laceration.  POSTOPERATIVE DIAGNOSIS:  Right forearm laceration with posterior interosseous nerve branch to ECU lacerations.  PROCEDURE:  Repair of posterior interosseous nerve branch to ECU.  SURGEON:  [name] LoaKevin Danel Requena, MD  ASSISTANT:  [name] [name], M.D.  ANESTHESIA:  General with regional.  IV FLUIDS:  Per anesthesia flow sheet.  ESTIMATED BLOOD LOSS:  Minimal.  COMPLICATIONS:  None.  SPECIMENS:  None.  TOURNIQUET TIME:  57 minutes.  DISPOSITION:  Stable to PACU.  INDICATIONS:  Mr. [name] [name] is a 23 year old right-hand-dominant male who lacerated his right forearm approximately 10 days ago.  [facility] where the wound was irrigated and debrided and sutured.  He was followed up in the office.  He had difficulty with extension of the ring and small fingers.  We discussed treatment options.  Recommend operative exploration of wound with repair of muscle, tendon, artery, nerve as necessary.  Risks, benefits, and alternatives of surgery were discussed including risk of blood loss; infection; damage to nerves, vessels, tendons, ligaments, bone; failure of surgery; need for additional surgery; complications with wound healing; continued pain; and continued weakness.  He voiced understanding of these risks and elected to proceed.  OPERATIVE COURSE:  After being identified preoperatively by myself, the patient and I agreed upon procedure and site of procedure.  Surgical site was marked.  The risks, benefits, and  alternatives of surgery were reviewed and he wished to proceed.  Surgical consent had been signed. He was given IV vancomycin as preoperative antibiotic prophylaxis due to PENICILLIN allergy.  Regional block was performed by Anesthesia in preoperative holding.  He was transported to the operating room and placed on the operating room table in supine position with the right upper extremity on arm board.  General anesthesia was induced by the anesthesiologist.  The right upper extremity was prepped and draped in normal sterile orthopedic fashion.  A surgical pause was performed between surgeons, anesthesia, and operating staff, and all were in agreement as to the patient, procedure, and site of procedure.  The sutures had been removed at the start of the case.  The sutures of the upper arm were removed at the end of the case.  The wound was entered. The traumatic wound course was followed.  The wound was extended distally to aid in visualization.  The wound course between the radius and the ulna at the posterior side of the radius.  The posterior interosseous nerve was identified.  Nerve branches to the FCR and APL and part of the EDC on the radial side were identified.  A nerve stimulator was used and these branches were functioning.  A laceration to the branch going to the ulnar side  presumably to the extensor of the ring and small finger was identified and had been lacerated.  Both the distal and proximal stumps were identified.  No other nerve laceration was identified.  The nerve ends were repaired using an 8-0 nylon suture in an interrupted circumferential fashion.  This was done under loupe magnification.  Acceptable apposition of the nerve ends was obtained.  A relaxing 3-0 Vicryl stitch was placed in the muscle to take tension off the nerve repair.  The wound was copiously irrigated with sterile saline.  The muscle was repaired back over top of the nerve repair using 3-0 Vicryl  suture and a single interrupted Vicryl suture was placed in subcutaneous tissues.  The skin was closed with 3-0 nylon in a horizontal mattress fashion.  The wound was dressed with sterile Xeroform, 4x4s, and wrapped with a Kerlix bandage.  Dorsal splint including the wrist and elbow was placed with the forearm in approximately neutral.  This was wrapped with Ace bandage.  The tourniquet was deflated at 57 minutes.  Fingertips were pinked with brisk capillary refill after deflation of tourniquet.  The operative drapes were broken down.  The patient was awoken from anesthesia safely. He was transferred back to stretcher and taken to PACU in stable condition.  I will see him back in the office in 1 week for postoperative followup.  I will give him Norco 5/325, 1 to 2 p.o. q.6 hours p.r.n. pain, dispensed #40.     [name] LoaKevin Marcanthony Sleight, MD     KK/MEDQ  D:  05/04/2014  T:  05/05/2014  Job:  [id number]

## 2014-05-06 ENCOUNTER — Encounter (HOSPITAL_BASED_OUTPATIENT_CLINIC_OR_DEPARTMENT_OTHER): Payer: Self-pay | Admitting: Orthopedic Surgery

## 2015-06-18 ENCOUNTER — Encounter (HOSPITAL_COMMUNITY): Payer: Self-pay | Admitting: Emergency Medicine

## 2015-06-18 ENCOUNTER — Emergency Department (INDEPENDENT_AMBULATORY_CARE_PROVIDER_SITE_OTHER)
Admission: EM | Admit: 2015-06-18 | Discharge: 2015-06-18 | Disposition: A | Discharge status: Home / Self Care | Payer: Managed Care, Other (non HMO) | Source: Home / Self Care | Attending: Emergency Medicine | Admitting: Emergency Medicine

## 2015-06-18 DIAGNOSIS — M62830 Muscle spasm of back: Secondary | ICD-10-CM | POA: Diagnosis not present

## 2015-06-18 MED ORDER — CYCLOBENZAPRINE HCL 10 MG PO TABS: 10.0000 mg | ORAL_TABLET | Freq: Three times a day (TID) | ORAL | Status: DC | PRN

## 2015-06-18 MED ORDER — MELOXICAM 15 MG PO TABS: 15.0000 mg | ORAL_TABLET | Freq: Every day | ORAL | Status: DC

## 2015-06-18 MED ORDER — HYDROCODONE-ACETAMINOPHEN 5-325 MG PO TABS: 1.0000 | ORAL_TABLET | Freq: Four times a day (QID) | ORAL | Status: DC | PRN

## 2015-06-18 NOTE — ED Notes (Signed)
Complains of back pain, mid to lower back.  Pain for a month.  No known recent injury.  2 days ago noticed tingling in left leg, but not now  Patient reports having issues in middle school, secondary to sports and participated in physical therapy.

## 2015-06-18 NOTE — ED Provider Notes (Signed)
CSN: [id number]     Arrival date & time 06/18/15  1532 History   First MD Initiated Contact with Patient 06/18/15 1541     Chief Complaint  Patient presents with  . Back Pain   (Consider location/radiation/quality/duration/timing/severity/associated sxs/prior Treatment) HPI  He is a 24 year old man here for evaluation of back pain. He states this started about a month ago. It is located in his bilateral mid and lower back. It is worse with any sort of movement including flexion, extension, and rotational movements. It is particularly bad when he is trying to lay down or get up. He did have one episode of some numbness in his left leg a few days ago, but this has resolved. He denies any lower extremity weakness or tingling. No bowel or bladder incontinence. No fevers. No known injury. He states he does lift 4 days a week at the gym. He has been taking BC powders without improvement.  Past Medical History  Diagnosis Date  . Laceration of right forearm with tendon involvement 04/22/2014   Past Surgical History  Procedure Laterality Date  . No past surgeries    . Wound exploration Right 05/04/2014    Procedure: RIGHT FOREARM WOUND EXPLORATION;  Surgeon: Betha LoaKevin Kuzma, MD;  Location: [facility];  Service: Orthopedics;  Laterality: Right;  . Nerve repair Right 05/04/2014    Procedure: NERVE REPAIR;  Surgeon: Betha LoaKevin Kuzma, MD;  Location:  SUR[facility]ERY CENTER;  Service: Orthopedics;  Laterality: Right;   No family history on file. Social History  Substance Use Topics  . Smoking status: Current Every Day Smoker -- 0.00 packs/day for 2 years    Types: Cigarettes  . Smokeless tobacco: Never Used     Comment: 4 cig./day  . Alcohol Use: Yes     Comment: 3 beers/day    Review of Systems As in history of present illness Allergies  Shrimp and Penicillins  Home Medications   Prior to Admission medications   Medication Sig Start Date End Date Taking? Authorizing Provider   cyclobenzaprine (FLEXERIL) 10 MG tablet Take 1 tablet (10 mg total) by mouth 3 (three) times daily as needed for muscle spasms. 06/18/15   Charm RingsErin J Norlan Rann, MD  HYDROcodone-acetaminophen (NORCO) 5-325 MG tablet Take 1 tablet by mouth every 6 (six) hours as needed for moderate pain. 06/18/15   Charm RingsErin J Treyon Wymore, MD  meloxicam (MOBIC) 15 MG tablet Take 1 tablet (15 mg total) by mouth daily. For 2 weeks, then as needed. 06/18/15   Charm RingsErin J Ravon Mortellaro, MD   Meds Ordered and Administered this Visit  Medications - No data to display  BP 136/83 mmHg  Pulse 88  Temp(Src) 98.2 F (36.8 C) (Oral)  Resp 18  SpO2 99% No data found.   Physical Exam  Constitutional: He is oriented to person, place, and time. He appears well-developed and well-nourished. No distress.  Neck: Neck supple.  Cardiovascular: Normal rate.   Pulmonary/Chest: Effort normal.  Musculoskeletal:  Back: No erythema or edema. No vertebral tenderness or step-offs. He has palpable spasm in bilateral thoracic and lumbar paraspinous muscles, worse on the right. Negative straight leg raise bilaterally. 5 out of 5 strength in bilateral lower extremities.  Neurological: He is alert and oriented to person, place, and time.    ED Course  Procedures (including critical care time)  Labs Review Labs Reviewed - No data to display  Imaging Review No results found.    MDM   1. Muscle spasm of back  Symptomatic treatment with heat and gentle stretching. Meloxicam daily for the next 2 weeks, then as needed. Flexeril and Norco as needed. No lifting until he can perform daily activities without pain. Follow-up with orthopedics if not improving in 2 weeks.    Charm Rings, MD 06/18/15 [phone]

## 2015-06-18 NOTE — Discharge Instructions (Signed)
You have muscle strain and spasm in her back. This likely started from lifting and has just not had a chance to heal. No lifting until you can perform daily activities without significant pain. Apply heat as often as you can. Start doing gentle stretching of your back. Take meloxicam daily for the next 2 weeks, then as needed. Take Flexeril 3 times a day as needed for pain. This medicine will make you drowsy. Use Norco every 4-6 hours as needed for severe pain. Do not drive while taking this medicine. You should see gradual improvement over the next 2 weeks. If things are not improving, please follow-up with orthopedics.

## 2015-07-16 ENCOUNTER — Emergency Department (HOSPITAL_COMMUNITY)
Admission: EM | Admit: 2015-07-16 | Discharge: 2015-07-16 | Disposition: A | Discharge status: Home / Self Care | Payer: Managed Care, Other (non HMO) | Source: Home / Self Care | Attending: Family Medicine | Admitting: Family Medicine

## 2015-07-16 ENCOUNTER — Encounter (HOSPITAL_COMMUNITY): Payer: Self-pay | Admitting: *Deleted

## 2015-07-16 DIAGNOSIS — S39012D Strain of muscle, fascia and tendon of lower back, subsequent encounter: Secondary | ICD-10-CM

## 2015-07-16 MED ORDER — KETOROLAC TROMETHAMINE 30 MG/ML IJ SOLN
INTRAMUSCULAR | Status: AC
  Filled 2015-07-16: qty 1

## 2015-07-16 MED ORDER — KETOROLAC TROMETHAMINE 30 MG/ML IJ SOLN
30.0000 mg | Freq: Once | INTRAMUSCULAR | Status: AC
  Administered 2015-07-16: 30 mg via INTRAMUSCULAR

## 2015-07-16 MED ORDER — DICLOFENAC POTASSIUM 50 MG PO TABS: 50.0000 mg | ORAL_TABLET | Freq: Three times a day (TID) | ORAL | Status: DC

## 2015-07-16 MED ORDER — METAXALONE 800 MG PO TABS: 800.0000 mg | ORAL_TABLET | Freq: Three times a day (TID) | ORAL | Status: DC

## 2015-07-16 NOTE — Discharge Instructions (Signed)
Medicine as needed, see orthopedist if further problems.

## 2015-07-16 NOTE — ED Provider Notes (Signed)
CSN: [id number]     Arrival date & time 07/16/15  1314 History   First MD Initiated Contact with Patient 07/16/15 1444     Chief Complaint  Patient presents with  . Back Pain   (Consider location/radiation/quality/duration/timing/severity/associated sxs/prior Treatment) Patient is a 25 y.o. male presenting with back pain. The history is provided by the patient.  Back Pain Location:  Lumbar spine Quality:  Stiffness Radiates to:  Does not radiate Pain severity:  Moderate Onset quality:  Sudden Duration:  1 week Progression:  Unchanged Chronicity:  Recurrent Context: lifting heavy objects   Context comment:  [facility] lbp improved, then at gym re-injured low back . Associated symptoms: no abdominal pain, no abdominal swelling, no bladder incontinence, no bowel incontinence, no chest pain, no dysuria, no leg pain, no numbness, no paresthesias and no weakness     Past Medical History  Diagnosis Date  . Laceration of right forearm with tendon involvement 04/22/2014   Past Surgical History  Procedure Laterality Date  . No past surgeries    . Wound exploration Right 05/04/2014    Procedure: RIGHT FOREARM WOUND EXPLORATION;  Surgeon: Betha LoaKevin Kuzma, MD;  Location: [facility];  Service: Orthopedics;  Laterality: Right;  . Nerve repair Right 05/04/2014    Procedure: NERVE REPAIR;  Surgeon: Betha LoaKevin Kuzma, MD;  Location: [facility];  Service: Orthopedics;  Laterality: Right;   History reviewed. No pertinent family history. Social History  Substance Use Topics  . Smoking status: Current Every Day Smoker -- 0.00 packs/day for 2 years    Types: Cigarettes  . Smokeless tobacco: Never Used     Comment: 4 cig./day  . Alcohol Use: Yes     Comment: 3 beers/day    Review of Systems  Constitutional: Negative.   Cardiovascular: Negative for chest pain.  Gastrointestinal: Negative.  Negative for abdominal pain and bowel incontinence.    Genitourinary: Negative.  Negative for bladder incontinence and dysuria.  Musculoskeletal: Positive for back pain. Negative for joint swelling and gait problem.  Skin: Negative.   Neurological: Negative for weakness, numbness and paresthesias.  All other systems reviewed and are negative.   Allergies  Shrimp and Penicillins  Home Medications   Prior to Admission medications   Medication Sig Start Date End Date Taking? Authorizing Provider  cyclobenzaprine (FLEXERIL) 10 MG tablet Take 1 tablet (10 mg total) by mouth 3 (three) times daily as needed for muscle spasms. 06/18/15   Charm RingsErin J Honig, MD  diclofenac (CATAFLAM) 50 MG tablet Take 1 tablet (50 mg total) by mouth 3 (three) times daily. For back pain 07/16/15   Linna HoffJames D Kattleya Kuhnert, MD  HYDROcodone-acetaminophen (NORCO) 5-325 MG tablet Take 1 tablet by mouth every 6 (six) hours as needed for moderate pain. 06/18/15   Charm RingsErin J Honig, MD  meloxicam (MOBIC) 15 MG tablet Take 1 tablet (15 mg total) by mouth daily. For 2 weeks, then as needed. 06/18/15   Charm RingsErin J Honig, MD  metaxalone (SKELAXIN) 800 MG tablet Take 1 tablet (800 mg total) by mouth 3 (three) times daily. As muscle relaxer 07/16/15   Linna HoffJames D Caspar Favila, MD   Meds Ordered and Administered this Visit   Medications  ketorolac (TORADOL) 30 MG/ML injection 30 mg (30 mg Intramuscular Given 07/16/15 1522)    BP 124/74 mmHg  Pulse 78  Temp(Src) 98.6 F (37 C) (Oral)  Resp 18  SpO2 100% No data found.   Physical Exam  Constitutional: He  is oriented to person, place, and time. He appears well-developed and well-nourished. He appears distressed.  Musculoskeletal: He exhibits tenderness.       Lumbar back: He exhibits decreased range of motion, tenderness, pain and spasm. He exhibits no swelling, no deformity and normal pulse.       Back:  Neurological: He is alert and oriented to person, place, and time.  Skin: Skin is warm and dry.  Nursing note and vitals reviewed.   ED Course   Procedures (including critical care time)  Labs Review Labs Reviewed - No data to display  Imaging Review No results found.   Visual Acuity Review  Right Eye Distance:   Left Eye Distance:   Bilateral Distance:    Right Eye Near:   Left Eye Near:    Bilateral Near:         MDM   1. Low back strain, subsequent encounter        Linna Hoff, MD 07/16/15 2125

## 2015-07-16 NOTE — ED Notes (Signed)
Pt  Reports    Low  Back  Pain     For  Several  Months          Pt  Was  Seen  1  Month   Ago    Pt  Reports     Was rx   Muscle  Relaxant      Pt  States  Got better   Went  Back  To the  Gym    And  Developed  Pain  Again

## 2016-01-04 ENCOUNTER — Ambulatory Visit (HOSPITAL_COMMUNITY)
Admission: EM | Admit: 2016-01-04 | Discharge: 2016-01-04 | Disposition: A | Discharge status: Home / Self Care | Payer: Managed Care, Other (non HMO) | Attending: Emergency Medicine | Admitting: Emergency Medicine

## 2016-01-04 ENCOUNTER — Encounter (HOSPITAL_COMMUNITY): Payer: Self-pay | Admitting: Family Medicine

## 2016-01-04 ENCOUNTER — Ambulatory Visit (INDEPENDENT_AMBULATORY_CARE_PROVIDER_SITE_OTHER): Payer: Managed Care, Other (non HMO)

## 2016-01-04 DIAGNOSIS — T59811A Toxic effect of smoke, accidental (unintentional), initial encounter: Secondary | ICD-10-CM

## 2016-01-04 DIAGNOSIS — R112 Nausea with vomiting, unspecified: Secondary | ICD-10-CM | POA: Diagnosis not present

## 2016-01-04 DIAGNOSIS — J705 Respiratory conditions due to smoke inhalation: Secondary | ICD-10-CM

## 2016-01-04 MED ORDER — ONDANSETRON 4 MG PO TBDP
4.0000 mg | ORAL_TABLET | Freq: Once | ORAL | Status: AC
  Administered 2016-01-04: 4 mg via ORAL

## 2016-01-04 MED ORDER — ONDANSETRON HCL 4 MG PO TABS: 4.0000 mg | ORAL_TABLET | Freq: Three times a day (TID) | ORAL | Status: DC | PRN
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
## 2016-01-04 NOTE — Discharge Instructions (Signed)
Nausea and Vomiting  Nausea means you feel sick to your stomach. Throwing up (vomiting) is a reflex where stomach contents come out of your mouth.  HOME CARE   · Take medicine as told by your doctor.  · Do not force yourself to eat. However, you do need to drink fluids.  · If you feel like eating, eat a normal diet as told by your doctor.    Eat rice, wheat, potatoes, bread, lean meats, yogurt, fruits, and vegetables.    Avoid high-fat foods.  · Drink enough fluids to keep your pee (urine) clear or pale yellow.  · Ask your doctor how to replace body fluid losses (rehydrate). Signs of body fluid loss (dehydration) include:    Feeling very thirsty.    Dry lips and mouth.    Feeling dizzy.    Dark pee.    Peeing less than normal.    Feeling confused.    Fast breathing or heart rate.  GET HELP RIGHT AWAY IF:   · You have blood in your throw up.  · You have black or bloody poop (stool).  · You have a bad headache or stiff neck.  · You feel confused.  · You have bad belly (abdominal) pain.  · You have chest pain or trouble breathing.  · You do not pee at least once every 8 hours.  · You have cold, clammy skin.  · You keep throwing up after 24 to 48 hours.  · You have a fever.  MAKE SURE YOU:   · Understand these instructions.  · Will watch your condition.  · Will get help right away if you are not doing well or get worse.     This information is not intended to replace advice given to you by your health care provider. Make sure you discuss any questions you have with your health care provider.     Document Released: 12/05/2007 Document Revised: 09/10/2011 Document Reviewed: 11/17/2010  Elsevier Interactive Patient Education ©2016 [employer]
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
## 2019-07-08 ENCOUNTER — Encounter (HOSPITAL_COMMUNITY): Payer: Self-pay | Admitting: Emergency Medicine

## 2019-07-08 ENCOUNTER — Other Ambulatory Visit: Payer: Self-pay

## 2019-07-08 ENCOUNTER — Ambulatory Visit (HOSPITAL_COMMUNITY)
Admission: EM | Admit: 2019-07-08 | Discharge: 2019-07-08 | Disposition: A | Discharge status: Home / Self Care | Payer: Managed Care, Other (non HMO) | Attending: Family Medicine | Admitting: Family Medicine

## 2019-07-08 DIAGNOSIS — Z20822 Contact with and (suspected) exposure to covid-19: Secondary | ICD-10-CM | POA: Insufficient documentation

## 2019-07-08 DIAGNOSIS — F17210 Nicotine dependence, cigarettes, uncomplicated: Secondary | ICD-10-CM | POA: Insufficient documentation

## 2019-07-08 DIAGNOSIS — J029 Acute pharyngitis, unspecified: Secondary | ICD-10-CM | POA: Insufficient documentation

## 2019-07-08 LAB — POCT RAPID STREP A: Streptococcus, Group A Screen (Direct): NEGATIVE

## 2019-07-08 NOTE — ED Provider Notes (Signed)
[facility]   [id number] 07/08/19 Arrival Time: 0901  ASSESSMENT & PLAN:  1. Sore throat     No signs of peritonsillar abscess. Discussed. COVID testing sent. See work note for self-isolation guidelines.  Results for orders placed or performed during the hospital encounter of 07/08/19  POCT rapid strep A [facility])  Result Value Ref Range   Streptococcus, Group A Screen (Direct) NEGATIVE NEGATIVE   Labs Reviewed  CULTURE, GROUP A STREP ([place])  NOVEL CORONAVIRUS, NAA (HOSP ORDER, SEND-OUT TO REF LAB; TAT 18-24 HRS)  POCT RAPID STREP A     Discharge Instructions     If your Covid-19 test is positive, you will receive a phone call from [facility] regarding your results. Negative test results are not called. Both positive and negative results area always visible on MyChart. If you do not have a MyChart account, sign up instructions are in your discharge papers.   You may use over the counter ibuprofen or acetaminophen as needed.   For a sore throat, over the counter products such as [employer] Peroxyl Mouth Sore Rinse or Chloraseptic Sore Throat Spray may provide some temporary relief.  Your rapid strep test was negative today. We have sent your throat swab for culture and will let you know of any positive results.    Reviewed expectations re: course of current medical issues. Questions answered. Outlined signs and symptoms indicating need for more acute intervention. Patient verbalized understanding. After Visit Summary given.   SUBJECTIVE:  [name] [name] is a 29 y.o. male who reports a sore throat. Describes as pain with swallowing. Onset abrupt beginning yesterday. Symptoms have progressed to a point and plateaued since beginning; without voice changes. No respiratory symptoms. Normal PO intake but reports discomfort with swallowing. No specific alleviating factors. Fever reported: Fever: absent. No neck pain or swelling. No associated nausea, vomiting,  or abdominal pain. Known sick contacts: none. Recent travel: none. OTC treatment: none.  ROS: As per HPI.   OBJECTIVE:  Vitals:   07/08/19 1029  BP: 123/74  Pulse: 68  Resp: 12  Temp: 98.4 F (36.9 C)  TempSrc: Oral  SpO2: 100%     General appearance: alert; no distress HEENT: throat with mild erythema and cobblestoning; tonsils are enlarged and without exudates; uvula is midline Neck: supple with FROM; no lymphadenopathy CV: RRR Lungs: clear to auscultation bilaterally Abd: soft; non-tender Skin: reveals no rash; warm and dry Psychological: alert and cooperative; normal mood and affect  Allergies  Allergen Reactions  . Shrimp [Shellfish Allergy] Swelling    SWELLING OF EYES  . Penicillins Rash    Past Medical History:  Diagnosis Date  . Laceration of right forearm with tendon involvement 04/22/2014   Social History   Socioeconomic History  . Marital status: Single    Spouse name: Not on file  . Number of children: Not on file  . Years of education: Not on file  . Highest education level: Not on file  Occupational History  . Not on file  Tobacco Use  . Smoking status: Current Every Day Smoker    Packs/day: 0.00    Years: 2.00    Pack years: 0.00    Types: Cigarettes  . Smokeless tobacco: Never Used  . Tobacco comment: 4 cig./day  Substance and Sexual Activity  . Alcohol use: Yes    Comment: 3 beers/day  . Drug use: No  . Sexual activity: Not on file  Other Topics Concern  . Not on file  Social History Narrative  . Not on file   Social Determinants of Health   Financial Resource Strain:   . Difficulty of Paying Living Expenses: Not on file  Food Insecurity:   . Worried About Programme researcher, broadcasting/film/video in the Last Year: Not on file  . Ran Out of Food in the Last Year: Not on file  Transportation Needs:   . Lack of Transportation (Medical): Not on file  . Lack of Transportation (Non-Medical): Not on file  Physical Activity:   . Days of Exercise per  Week: Not on file  . Minutes of Exercise per Session: Not on file  Stress:   . Feeling of Stress : Not on file  Social Connections:   . Frequency of Communication with Friends and Family: Not on file  . Frequency of Social Gatherings with Friends and Family: Not on file  . Attends Religious Services: Not on file  . Active Member of Clubs or Organizations: Not on file  . Attends Banker Meetings: Not on file  . Marital Status: Not on file  Intimate Partner Violence:   . Fear of Current or Ex-Partner: Not on file  . Emotionally Abused: Not on file  . Physically Abused: Not on file  . Sexually Abused: Not on file   Family History  Problem Relation Age of Onset  . Diabetes Mother   . Diabetes Father           Mardella Layman, MD 07/08/19 1130

## 2019-07-08 NOTE — ED Triage Notes (Signed)
Pt reports swelling and pain in his throat that started last night.  Pt denies any other URI symptoms, no fever, and no loss of taste or smell.

## 2019-07-08 NOTE — Discharge Instructions (Addendum)
If your Covid-19 test is positive, you will receive a phone call from [facility] regarding your results. Negative test results are not called. Both positive and negative results area always visible on MyChart. If you do not have a MyChart account, sign up instructions are in your discharge papers.  You may use over the counter ibuprofen or acetaminophen as needed.  For a sore throat, over the counter products such as [employer] Peroxyl Mouth Sore Rinse or Chloraseptic Sore Throat Spray may provide some temporary relief. Your rapid strep test was negative today. We have sent your throat swab for culture and will let you know of any positive results.

## 2019-07-10 LAB — CULTURE, GROUP A STREP (THRC)

## 2019-07-10 LAB — NOVEL CORONAVIRUS, NAA (HOSP ORDER, SEND-OUT TO REF LAB; TAT 18-24 HRS): SARS-CoV-2, NAA: NOT DETECTED

## 2021-01-04 ENCOUNTER — Encounter (HOSPITAL_COMMUNITY): Payer: Self-pay

## 2021-01-04 ENCOUNTER — Emergency Department (HOSPITAL_COMMUNITY)
Admission: EM | Admit: 2021-01-04 | Discharge: 2021-01-04 | Disposition: A | Discharge status: Home / Self Care | Payer: Managed Care, Other (non HMO) | Attending: Emergency Medicine | Admitting: Emergency Medicine

## 2021-01-04 ENCOUNTER — Other Ambulatory Visit: Payer: Self-pay

## 2021-01-04 DIAGNOSIS — K0889 Other specified disorders of teeth and supporting structures: Secondary | ICD-10-CM | POA: Diagnosis present

## 2021-01-04 DIAGNOSIS — F17210 Nicotine dependence, cigarettes, uncomplicated: Secondary | ICD-10-CM | POA: Diagnosis not present

## 2021-01-04 DIAGNOSIS — K029 Dental caries, unspecified: Secondary | ICD-10-CM | POA: Insufficient documentation

## 2021-01-04 MED ORDER — ACETAMINOPHEN 500 MG PO TABS
1000.0000 mg | ORAL_TABLET | Freq: Once | ORAL | Status: AC
  Administered 2021-01-04: 1000 mg via ORAL
  Filled 2021-01-04: qty 2

## 2021-01-04 MED ORDER — IBUPROFEN 600 MG PO TABS: 600.0000 mg | ORAL_TABLET | Freq: Three times a day (TID) | ORAL | 0 refills | Status: DC

## 2021-01-04 MED ORDER — ACETAMINOPHEN 500 MG PO TABS: 500.0000 mg | ORAL_TABLET | Freq: Three times a day (TID) | ORAL | 0 refills | Status: DC | PRN

## 2021-01-04 MED ORDER — IBUPROFEN 200 MG PO TABS
600.0000 mg | ORAL_TABLET | Freq: Once | ORAL | Status: AC
  Administered 2021-01-04: 600 mg via ORAL
  Filled 2021-01-04: qty 3

## 2021-01-04 NOTE — ED Triage Notes (Signed)
Pt c/o cavity on bottom left side of mouth, needs dental work but does not have insurance currently. C/o pain and would like something to control the pain. Denies difficulty swallowing

## 2021-01-04 NOTE — Discharge Instructions (Signed)
Please read and follow all provided instructions.  Your diagnoses today include:  1. Pain due to dental caries     The exam and treatment you received today has been provided on an emergency basis only. This is not a substitute for complete medical or dental care.  Tests performed today include: Vital signs. See below for your results today.   Medications prescribed:  Ibuprofen (Motrin, Advil) - anti-inflammatory pain medication Do not exceed 600mg  ibuprofen every 6 hours, take with food  You have been prescribed an anti-inflammatory medication or NSAID. Take with food. Take smallest effective dose for the shortest duration needed for your pain. Stop taking if you experience stomach pain or vomiting.   Acetaminophen - for pain  Take any prescribed medications only as directed.  Home care instructions:  Follow any educational materials contained in this packet.  Follow-up instructions: Please follow-up with your dentist for further evaluation of your symptoms.   Dental Assistance: See attached dental referral and/or resource guide.   Return instructions:  Please return to the Emergency Department if you experience worsening symptoms. Please return if you develop a fever, you develop more swelling in your face or neck, you have trouble breathing or swallowing food. Please return if you have any other emergent concerns.  Additional Information:  Your vital signs today were: BP 119/73   Pulse 65   Temp 98.3 F (36.8 C) (Oral)   Resp 16   Ht 5\' 10"  (1.778 m)   Wt 83.9 kg   SpO2 100%   BMI 26.54 kg/m  If your blood pressure (BP) was elevated above 135/85 this visit, please have this repeated by your doctor within one month. --------------

## 2021-01-04 NOTE — ED Provider Notes (Signed)
[facility]EMERGENCY DEPT Provider Note   CSN: [id number] Arrival date & time: 01/04/21  2774     History Chief Complaint  Patient presents with   Dental Pain    [name] [name] is a 30 y.o. male.  Patient presents emergency department for evaluation of left lower jaw pain in the area of a known cavity.  Patient has had waxing and waning pain.  He states that his dental insurance has not yet kicked in.  No facial swelling.  No difficulty breathing or swallowing.  He has been taking BC and Goody powder for pain.      Past Medical History:  Diagnosis Date   Laceration of right forearm with tendon involvement 04/22/2014    There are no problems to display for this patient.   Past Surgical History:  Procedure Laterality Date   NERVE REPAIR Right 05/04/2014   Procedure: NERVE REPAIR;  Surgeon: Betha Loa, MD;  Location: [facility];  Service: Orthopedics;  Laterality: Right;   NO PAST SURGERIES     WOUND EXPLORATION Right 05/04/2014   Procedure: RIGHT FOREARM WOUND EXPLORATION;  Surgeon: Betha Loa, MD;  Location: [facility];  Service: Orthopedics;  Laterality: Right;       Family History  Problem Relation Age of Onset   Diabetes Mother    Diabetes Father     Social History   Tobacco Use   Smoking status: Every Day    Packs/day: 0.00    Years: 2.00    Pack years: 0.00    Types: Cigarettes   Smokeless tobacco: Never   Tobacco comments:    4 cig./day  Substance Use Topics   Alcohol use: Yes    Comment: occ   Drug use: No    Home Medications Prior to Admission medications   Medication Sig Start Date End Date Taking? Authorizing Provider  acetaminophen (TYLENOL) 500 MG tablet Take 1 tablet (500 mg total) by mouth every 8 (eight) hours as needed. 01/04/21  Yes Renne Crigler, PA-C  ibuprofen (ADVIL) 600 MG tablet Take 1 tablet (600 mg total) by mouth 3 (three) times daily. 01/04/21  Yes Renne Crigler, PA-C     Allergies    Shrimp [shellfish allergy] and Penicillins  Review of Systems   Review of Systems  Constitutional:  Negative for fever.  HENT:  Positive for dental problem and ear pain. Negative for facial swelling, sore throat and trouble swallowing.   Respiratory:  Negative for shortness of breath and stridor.   Musculoskeletal:  Negative for neck pain.  Skin:  Negative for color change.  Neurological:  Positive for headaches.   Physical Exam Updated Vital Signs BP 119/73   Pulse 65   Temp 98.3 F (36.8 C) (Oral)   Resp 16   Ht 5\' 10"  (1.778 m)   Wt 83.9 kg   SpO2 100%   BMI 26.54 kg/m   Physical Exam Vitals and nursing note reviewed.  Constitutional:      Appearance: He is well-developed.  HENT:     Head: Normocephalic and atraumatic.     Jaw: No trismus.     Right Ear: Tympanic membrane, ear canal and external ear normal.     Left Ear: Tympanic membrane, ear canal and external ear normal.     Ears:     Comments: Patient with a large cavity in tooth #19.  No gum swelling or gross abscess.    Nose: Nose normal.     Mouth/Throat:  Dentition: Abnormal dentition. Dental caries present. No dental abscesses.     Pharynx: Uvula midline. No uvula swelling.     Tonsils: No tonsillar abscesses.  Eyes:     Pupils: Pupils are equal, round, and reactive to light.  Neck:     Comments: No neck swelling or Lugwig's angina Musculoskeletal:     Cervical back: Normal range of motion and neck supple.  Skin:    General: Skin is warm and dry.  Neurological:     Mental Status: He is alert.  Psychiatric:        Mood and Affect: Mood normal.    ED Results / Procedures / Treatments   Labs (all labs ordered are listed, but only abnormal results are displayed) Labs Reviewed - No data to display  EKG None  Radiology No results found.  Procedures Procedures   Medications Ordered in ED Medications  acetaminophen (TYLENOL) tablet 1,000 mg (has no administration in time  range)  ibuprofen (ADVIL) tablet 600 mg (has no administration in time range)    ED Course  I have reviewed the triage vital signs and the nursing notes.  Pertinent labs & imaging results that were available during my care of the patient were reviewed by me and considered in my medical decision making (see chart for details).  11:03 AM Patient seen and examined. Medications ordered.   Vital signs reviewed and are as follows: BP 119/73   Pulse 65   Temp 98.3 F (36.8 C) (Oral)   Resp 16   Ht 5\' 10"  (1.778 m)   Wt 83.9 kg   SpO2 100%   BMI 26.54 kg/m   Plan: rx ibuprofen, tylenol, dental f/u when able.   Patient counseled to take prescribed medications as directed, return with worsening facial or neck swelling, and to follow-up with their dentist as soon as possible.      MDM Rules/Calculators/A&P                          Patient presents for dental pain. They do not have a fever and do not appear septic. Exam unconcerning for Ludwig's angina or other deep tissue infection in neck and I do not feel that advanced imaging is indicated at this time. Low suspicion for PTA, RPA, epiglottis based on exam.   Doubt dental infection at this time.  Encouraged tylenol/NSAIDs as prescribed or as directed on the packaging for pain. Encouraged follow-up with a dentist for definitive and long-term management.   Final Clinical Impression(s) / ED Diagnoses Final diagnoses:  Pain due to dental caries    Rx / DC Orders ED Discharge Orders          Ordered    acetaminophen (TYLENOL) 500 MG tablet  Every 8 hours PRN        01/04/21 1059    ibuprofen (ADVIL) 600 MG tablet  3 times daily        01/04/21 1059             03/07/21, PA-C 01/04/21 1104    03/07/21, MD 01/04/21 1323

## 2021-11-07 ENCOUNTER — Ambulatory Visit (INDEPENDENT_AMBULATORY_CARE_PROVIDER_SITE_OTHER): Payer: Managed Care, Other (non HMO)

## 2021-11-07 ENCOUNTER — Ambulatory Visit (HOSPITAL_COMMUNITY)
Admission: EM | Admit: 2021-11-07 | Discharge: 2021-11-07 | Disposition: A | Discharge status: Home / Self Care | Payer: Managed Care, Other (non HMO) | Attending: Internal Medicine | Admitting: Internal Medicine

## 2021-11-07 ENCOUNTER — Encounter (HOSPITAL_COMMUNITY): Payer: Self-pay

## 2021-11-07 DIAGNOSIS — S8992XA Unspecified injury of left lower leg, initial encounter: Secondary | ICD-10-CM | POA: Diagnosis not present

## 2021-11-07 DIAGNOSIS — M25562 Pain in left knee: Secondary | ICD-10-CM

## 2021-11-07 MED ORDER — KETOROLAC TROMETHAMINE 30 MG/ML IJ SOLN: 30.0000 mg | Freq: Once | INTRAMUSCULAR | Status: DC

## 2021-11-07 MED ORDER — KETOROLAC TROMETHAMINE 60 MG/2ML IM SOLN
60.0000 mg | Freq: Once | INTRAMUSCULAR | Status: AC
  Administered 2021-11-07: 60 mg via INTRAMUSCULAR

## 2021-11-07 MED ORDER — NAPROXEN 500 MG PO TABS: 500.0000 mg | ORAL_TABLET | Freq: Two times a day (BID) | ORAL | 0 refills | Status: DC

## 2021-11-07 MED ORDER — KETOROLAC TROMETHAMINE 60 MG/2ML IM SOLN
INTRAMUSCULAR | Status: AC
  Filled 2021-11-07: qty 2

## 2021-11-07 NOTE — Discharge Instructions (Addendum)
You were seen today for your knee sprain.  Your x-ray was negative for any acute bony abnormality.  Please continue to wear the knee brace for stabilization and to decrease swelling.  Use the crutches to assist with walking, but apply weight to your knee as tolerated.  You were given Toradol injection today in the clinic for pain management.  Your first dose of naproxen prescribed can be tonight with food at around 7 PM.  Please take naproxen twice daily for the next 5 days for pain and inflammation as your knee sprain heals. ? ?I have given you the information to call sports medicine if your left knee is not improving in the next 2 to 3 weeks.  Give them a call for further evaluation and management of your knee pain due to reinjury. ? ?If you develop any new or worsening symptoms or do not improve in the next 2 to 3 days, please return.  If your symptoms are severe, please go to the emergency room.  Follow-up with sports medicine as advised.  Also follow-up with primary care provider if you have any new or worsening symptoms and for ongoing wellness visits.  I hope you feel better!  Take it easy! ?

## 2021-11-07 NOTE — ED Triage Notes (Signed)
Pt presents with left knee injury after falling while playing basketball yesterday. ?

## 2021-11-07 NOTE — ED Provider Notes (Signed)
?[facility] ? ? ? ?CSN: [id number] ?Arrival date & time: 11/07/21  0810 ? ? ?  ? ?History   ?Chief Complaint ?Chief Complaint  ?Patient presents with  ? Knee Injury  ? ? ?HPI ?[name] [name] is a 31 y.o. male.  ? ?Patient presents urgent care for evaluation of his left knee pain after he injured it after a fall while playing basketball yesterday afternoon.  He states that he has a past history of injuring his left knee in the same way 1 month ago that healed properly per patient.  He denies pain to any other joints and denies hitting his head or losing consciousness.  His knee became very swollen immediately after the injury and he has been applying ice to his knee all night long.  Swelling is still present today. He has also been taking Tylenol and ibuprofen for his pain with minimal relief.  Currently rates his pain as a 10 on a scale of 0-10 and is generalized throughout his left knee.  Pain is worsened with 180 degree extension and 45 degree flexion of his left knee.  He is unable to bear weight on his left leg at this time and walks with an antalgic gait.  His last dose of medication was this morning at 3 AM when he took Tylenol.  He has not had any ibuprofen since yesterday after his injury.  Denies numbness and tingling distal to injury as well as left ankle pain and left hip pain.  He denies any other aggravating or relieving factors at this time. ? ? ? ?Past Medical History:  ?Diagnosis Date  ? Laceration of right forearm with tendon involvement 04/22/2014  ? ? ?There are no problems to display for this patient. ? ? ?Past Surgical History:  ?Procedure Laterality Date  ? NERVE REPAIR Right 05/04/2014  ? Procedure: NERVE REPAIR;  Surgeon: Betha Loa, MD;  Location: [facility];  Service: Orthopedics;  Laterality: Right;  ? NO PAST SURGERIES    ? WOUND EXPLORATION Right 05/04/2014  ? Procedure: RIGHT FOREARM WOUND EXPLORATION;  Surgeon: Betha Loa, MD;  Location: [facility];  Service: Orthopedics;  Laterality: Right;  ? ? ? ? ? ?Home Medications   ? ?Prior to Admission medications   ?Medication Sig Start Date End Date Taking? Authorizing Provider  ?naproxen (NAPROSYN) 500 MG tablet Take 1 tablet (500 mg total) by mouth 2 (two) times daily. 11/07/21  Yes Carlisle Beers, FNP  ?acetaminophen (TYLENOL) 500 MG tablet Take 1 tablet (500 mg total) by mouth every 8 (eight) hours as needed. 01/04/21   Renne Crigler, PA-C  ? ? ?Family History ?Family History  ?Problem Relation Age of Onset  ? Diabetes Mother   ? Diabetes Father   ? ? ?Social History ?Social History  ? ?Tobacco Use  ? Smoking status: Every Day  ?  Packs/day: 0.00  ?  Years: 2.00  ?  Pack years: 0.00  ?  Types: Cigarettes  ? Smokeless tobacco: Never  ? Tobacco comments:  ?  4 cig./day  ?Substance Use Topics  ? Alcohol use: Yes  ?  Comment: occ  ? Drug use: No  ? ? ? ?Allergies   ?Shrimp [shellfish allergy] and Penicillins ? ? ?Review of Systems ?Review of Systems ?Per HPI ? ?Physical Exam ?Triage Vital Signs ?ED Triage Vitals  ?Enc Vitals Group  ?   BP 11/07/21 0839 134/84  ?   Pulse Rate 11/07/21 0839 83  ?  Resp 11/07/21 0839 20  ?   Temp 11/07/21 0839 98.4 ?F (36.9 ?C)  ?   Temp Source 11/07/21 0839 Oral  ?   SpO2 11/07/21 0839 96 %  ?   Weight --   ?   Height --   ?   Head Circumference --   ?   Peak Flow --   ?   Pain Score 11/07/21 0838 8  ?   Pain Loc --   ?   Pain Edu? --   ?   Excl. in [place]? --   ? ?No data found. ? ?Updated Vital Signs ?BP 134/84 (BP Location: Right Arm)   Pulse 83   Temp 98.4 ?F (36.9 ?C) (Oral)   Resp 20   SpO2 96%  ? ?Visual Acuity ?Right Eye Distance:   ?Left Eye Distance:   ?Bilateral Distance:   ? ?Right Eye Near:   ?Left Eye Near:    ?Bilateral Near:    ? ?Physical Exam ?Vitals and nursing note reviewed.  ?Constitutional:   ?   General: He is not in acute distress. ?   Appearance: Normal appearance. He is well-developed. He is not ill-appearing.  ?   Comments: Very pleasant patient  sitting comfortably on exam able in no acute distress.   ?HENT:  ?   Head: Normocephalic and atraumatic.  ?   Right Ear: External ear normal.  ?   Left Ear: External ear normal.  ?   Nose: Nose normal.  ?   Mouth/Throat:  ?   Mouth: Mucous membranes are moist.  ?Eyes:  ?   General: Lids are normal. Vision grossly intact. Gaze aligned appropriately.  ?   Extraocular Movements: Extraocular movements intact.  ?   Conjunctiva/sclera: Conjunctivae normal.  ?   Right eye: Right conjunctiva is not injected.  ?   Left eye: Left conjunctiva is not injected.  ?Cardiovascular:  ?   Rate and Rhythm: Normal rate and regular rhythm.  ?   Heart sounds: Normal heart sounds, S1 normal and S2 normal.  ?Pulmonary:  ?   Effort: Pulmonary effort is normal.  ?   Breath sounds: Normal breath sounds. No decreased air movement.  ?Abdominal:  ?   Palpations: Abdomen is soft.  ?Musculoskeletal:     ?   General: Swelling present.  ?   Cervical back: Neck supple.  ?   Right knee: Normal.  ?   Left knee: Swelling and bony tenderness present. No erythema, ecchymosis or crepitus. Decreased range of motion. Tenderness present over the MCL. No LCL laxity, MCL laxity, ACL laxity or PCL laxity.Normal pulse.  ?   Comments: 5/5 strength to abduction and abduction against resistance with flexion and extension of left knee.  No laxity noted to physical exam.  Sensation is intact.  +2 anterior tibial pulses palpated bilaterally.  No ecchymosis present.  Left knee is swollen and very tender to palpation over MCL and generalized pain to his anterior and posterior knee.   ?Skin: ?   General: Skin is warm and dry.  ?   Capillary Refill: Capillary refill takes less than 2 seconds.  ?   Findings: No rash.  ?Neurological:  ?   General: No focal deficit present.  ?   Mental Status: He is alert and oriented to person, place, and time. Mental status is at baseline.  ?   Gait: Gait is intact.  ?Psychiatric:     ?   Attention and Perception: Attention and perception  normal.     ?   Mood and Affect: Mood normal.     ?   Speech: Speech normal.     ?   Behavior: Behavior normal. Behavior is cooperative.     ?   Thought Content: Thought content normal.     ?   Cognition and Memory: Cognition and memory normal.     ?   Judgment: Judgment normal.  ? ? ? ?UC Treatments / Results  ?Labs ?(all labs ordered are listed, but only abnormal results are displayed) ?Labs Reviewed - No data to display ? ?EKG ? ? ?Radiology ?DG Knee Complete 4 Views Left ? ?Result Date: 11/07/2021 ?CLINICAL DATA:  Knee injury playing basketball. EXAM: LEFT KNEE - COMPLETE 4+ VIEW COMPARISON:  None Available. FINDINGS: Four view study. No fracture or dislocation. Joint effusion evident. No worrisome lytic or sclerotic osseous abnormality. IMPRESSION: Joint effusion without acute bony findings. Electronically Signed   By: Misty Stanley M.D.   On: 11/07/2021 09:43   ? ?Procedures ?Procedures (including critical care time) ? ?Medications Ordered in UC ?Medications  ?ketorolac (TORADOL) injection 60 mg (60 mg Intramuscular Given 11/07/21 1048)  ? ? ?Initial Impression / Assessment and Plan / UC Course  ?I have reviewed the triage vital signs and the nursing notes. ? ?Pertinent labs & imaging results that were available during my care of the patient were reviewed by me and considered in my medical decision making (see chart for details). ? ?Patient is a 31 year old male presenting to urgent care today after a knee injury to his left knee while playing basketball.  He states that he injured his knee 1 month ago and did not seek medical care at that time.  His knee healed on its own from the injury 1 month ago with supportive care.  Today, he is unable to bear weight on his left knee and walks with an antalgic gait.  He has been using a compression sleeve to his left knee to help with swelling, but he states that the sleeve is too tight and is cutting off his circulation.   ? ?X-ray to left knee is negative for any acute  bony abnormality.  X-ray shows joint effusion likely due to injury.  Appropriately fitting knee brace provided to patient today for stabilization and compression.  Crutches also given to patient today for ambulati

## 2023-04-17 ENCOUNTER — Encounter (HOSPITAL_COMMUNITY): Payer: Self-pay

## 2023-04-17 ENCOUNTER — Ambulatory Visit (HOSPITAL_COMMUNITY)
Admission: RE | Admit: 2023-04-17 | Discharge: 2023-04-17 | Disposition: A | Discharge status: Home / Self Care | Payer: Managed Care, Other (non HMO) | Source: Ambulatory Visit | Attending: Emergency Medicine | Admitting: Emergency Medicine

## 2023-04-17 VITALS — BP 158/76 | HR 87 | Temp 98.0°F | Resp 18

## 2023-04-17 DIAGNOSIS — R350 Frequency of micturition: Secondary | ICD-10-CM | POA: Insufficient documentation

## 2023-04-17 DIAGNOSIS — Z113 Encounter for screening for infections with a predominantly sexual mode of transmission: Secondary | ICD-10-CM | POA: Diagnosis present

## 2023-04-17 LAB — POCT URINALYSIS DIP (MANUAL ENTRY)
Bilirubin, UA: NEGATIVE
Blood, UA: NEGATIVE
Glucose, UA: NEGATIVE mg/dL
Ketones, POC UA: NEGATIVE mg/dL
Leukocytes, UA: NEGATIVE
Nitrite, UA: NEGATIVE
Protein Ur, POC: NEGATIVE mg/dL
Spec Grav, UA: 1.015 (ref 1.010–1.025)
Urobilinogen, UA: 0.2 U/dL
pH, UA: 7 (ref 5.0–8.0)

## 2023-04-17 NOTE — ED Provider Notes (Signed)
[facility]    CSN: [id number] Arrival date & time: 04/17/23  1537      History   Chief Complaint Chief Complaint  Patient presents with   Urinary Frequency    Entered by patient    HPI [name] [name] is a 32 y.o. male.   Patient presents to clinic for urinary frequency for the past week. He thinks it is related to caffeine intake, he has about 4 cups of coffee in the mornings, a celsius (200 gm of caffeine) and then maybe a cup of coffee later in the day.   His wife recently had BV and yeast.   Denies penile sores, discharge, dysuria, suprapubic pain, nausea, emesis or fatigue. No penile discharge.   The history is provided by the patient and medical records.  Urinary Frequency Pertinent negatives include no abdominal pain.    Past Medical History:  Diagnosis Date   Laceration of right forearm with tendon involvement 04/22/2014    There are no problems to display for this patient.   Past Surgical History:  Procedure Laterality Date   NERVE REPAIR Right 05/04/2014   Procedure: NERVE REPAIR;  Surgeon: Betha Loa, MD;  Location: [facility];  Service: Orthopedics;  Laterality: Right;   NO PAST SURGERIES     WOUND EXPLORATION Right 05/04/2014   Procedure: RIGHT FOREARM WOUND EXPLORATION;  Surgeon: Betha Loa, MD;  Location: [facility];  Service: Orthopedics;  Laterality: Right;       Home Medications    Prior to Admission medications   Not on File    Family History Family History  Problem Relation Age of Onset   Diabetes Mother    Diabetes Father     Social History Social History   Tobacco Use   Smoking status: Every Day    Current packs/day: 0.00    Types: Cigarettes   Smokeless tobacco: Never   Tobacco comments:    4 cig./day  Substance Use Topics   Alcohol use: Yes    Comment: occ   Drug use: No     Allergies   Shrimp [shellfish allergy] and Penicillins   Review of Systems Review of Systems   Gastrointestinal:  Negative for abdominal pain, nausea and vomiting.  Genitourinary:  Positive for frequency and urgency. Negative for decreased urine volume, dysuria, flank pain, genital sores, hematuria, penile discharge, penile pain, penile swelling and testicular pain.     Physical Exam Triage Vital Signs ED Triage Vitals  Encounter Vitals Group     BP 04/17/23 1554 (!) 158/76     Systolic BP Percentile --      Diastolic BP Percentile --      Pulse Rate 04/17/23 1554 87     Resp 04/17/23 1554 18     Temp 04/17/23 1554 98 F (36.7 C)     Temp Source 04/17/23 1554 Oral     SpO2 04/17/23 1554 98 %     Weight --      Height --      Head Circumference --      Peak Flow --      Pain Score 04/17/23 1558 0     Pain Loc --      Pain Education --      Exclude from Growth Chart --    No data found.  Updated Vital Signs BP (!) 158/76 (BP Location: Left Arm)   Pulse 87   Temp 98 F (36.7 C) (Oral)   Resp 18  SpO2 98%   Visual Acuity Right Eye Distance:   Left Eye Distance:   Bilateral Distance:    Right Eye Near:   Left Eye Near:    Bilateral Near:     Physical Exam Vitals and nursing note reviewed.  Constitutional:      Appearance: Normal appearance.  HENT:     Head: Normocephalic and atraumatic.     Right Ear: External ear normal.     Left Ear: External ear normal.     Nose: Nose normal.     Mouth/Throat:     Mouth: Mucous membranes are moist.  Cardiovascular:     Rate and Rhythm: Normal rate.  Pulmonary:     Effort: Pulmonary effort is normal. No respiratory distress.  Musculoskeletal:        General: Normal range of motion.  Skin:    General: Skin is warm and dry.  Neurological:     General: No focal deficit present.     Mental Status: He is alert and oriented to person, place, and time.  Psychiatric:        Mood and Affect: Mood normal.        Behavior: Behavior normal.      UC Treatments / Results  Labs (all labs ordered are listed, but  only abnormal results are displayed) Labs Reviewed  POCT URINALYSIS DIP (MANUAL ENTRY)  CYTOLOGY, (ORAL, ANAL, URETHRAL) ANCILLARY ONLY    EKG   Radiology No results found.  Procedures Procedures (including critical care time)  Medications Ordered in UC Medications - No data to display  Initial Impression / Assessment and Plan / UC Course  I have reviewed the triage vital signs and the nursing notes.  Pertinent labs & imaging results that were available during my care of the patient were reviewed by me and considered in my medical decision making (see chart for details).  Vitals and triage reviewed, patient is hemodynamically stable.  Reports urinary frequency for the past week.  UA negative. Cytology swab also obtained per patient request, discussed BV and yeast are not STIs.  Advised caffeine reduction.  Staff will contact if treatment is indicated based on cytology swab results.  Plan of care, follow-up care and return precautions given, no questions at this time.     Final Clinical Impressions(s) / UC Diagnoses   Final diagnoses:  Urinary frequency     Discharge Instructions      Your urine did not show any signs of infection.  I suggest cutting back on your caffeine use as this is a urinary irritant.   The cytology swab we obtained today will be back over the next few days and our staff will contact you if anything is abnormal.     ED Prescriptions   None    PDMP not reviewed this encounter.   Lennie Vasco, [place], [place] 04/17/23 [phone]

## 2023-04-17 NOTE — Discharge Instructions (Signed)
Your urine did not show any signs of infection.  I suggest cutting back on your caffeine use as this is a urinary irritant.   The cytology swab we obtained today will be back over the next few days and our staff will contact you if anything is abnormal.

## 2023-04-17 NOTE — ED Triage Notes (Signed)
Pt presents to the office for dysuria x 1 week. Pt states he wife has BV and Yeast.

## 2023-04-18 ENCOUNTER — Telehealth (HOSPITAL_COMMUNITY): Payer: Self-pay | Admitting: Emergency Medicine

## 2023-04-18 LAB — CYTOLOGY, (ORAL, ANAL, URETHRAL) ANCILLARY ONLY
Chlamydia: POSITIVE — AB
Comment: NEGATIVE
Comment: NEGATIVE
Comment: NORMAL
Neisseria Gonorrhea: NEGATIVE
Trichomonas: NEGATIVE

## 2023-04-18 MED ORDER — DOXYCYCLINE HYCLATE 100 MG PO CAPS: 100.0000 mg | ORAL_CAPSULE | Freq: Two times a day (BID) | ORAL | 0 refills | Status: DC

## 2023-04-18 NOTE — Telephone Encounter (Signed)
Spoke with the patient with 2 identifiers.  Discussed positive chlamydia results, will treat with doxycycline.  Advised to abstain from intercourse and to have his wife receive STI screening as well.  Patient verbalized understanding, no questions at this time.

## 2023-10-31 IMAGING — DX DG KNEE COMPLETE 4+V*L*
4 series · 4 of 4 positions shown · non-contrast
Comparison: None Available.

CLINICAL DATA: Knee injury playing basketball.

EXAM:
LEFT KNEE - COMPLETE 4+ VIEW

[knee ap]
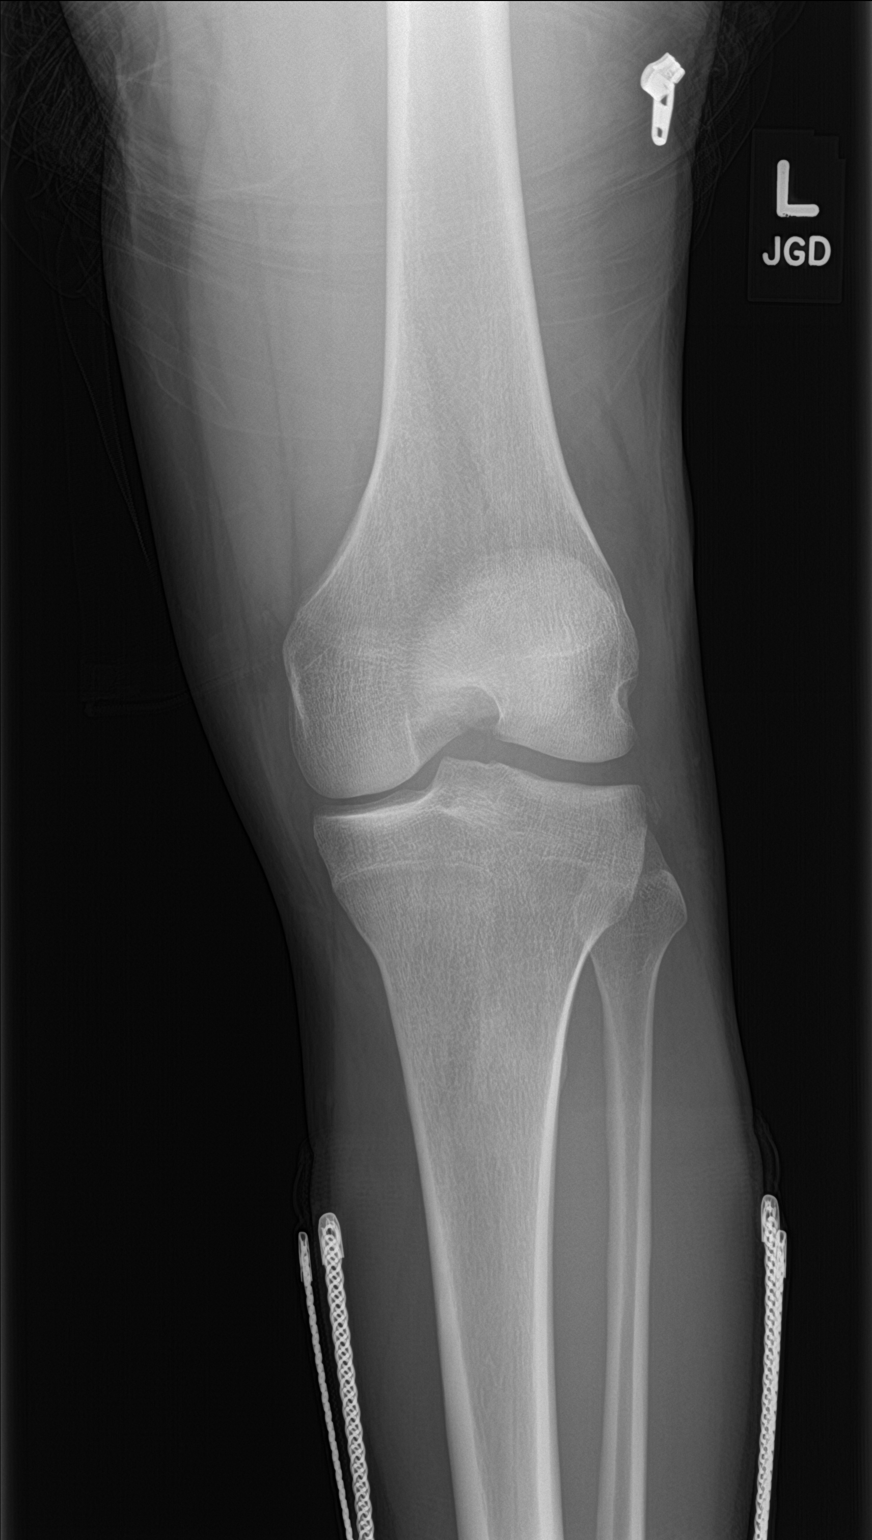

[knee obl (1 of 2)]
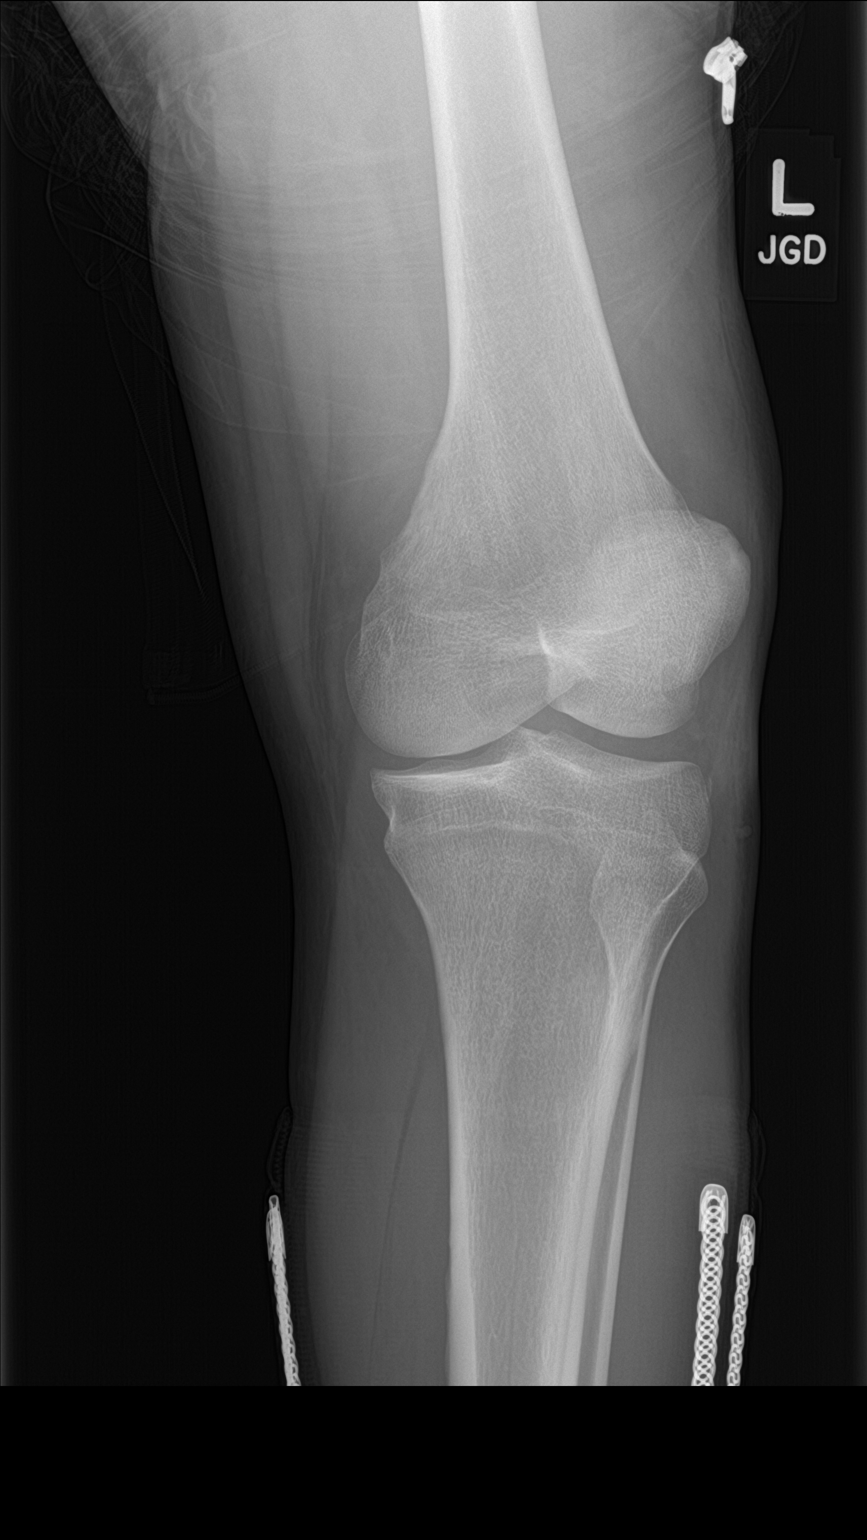

[knee obl (2 of 2)]
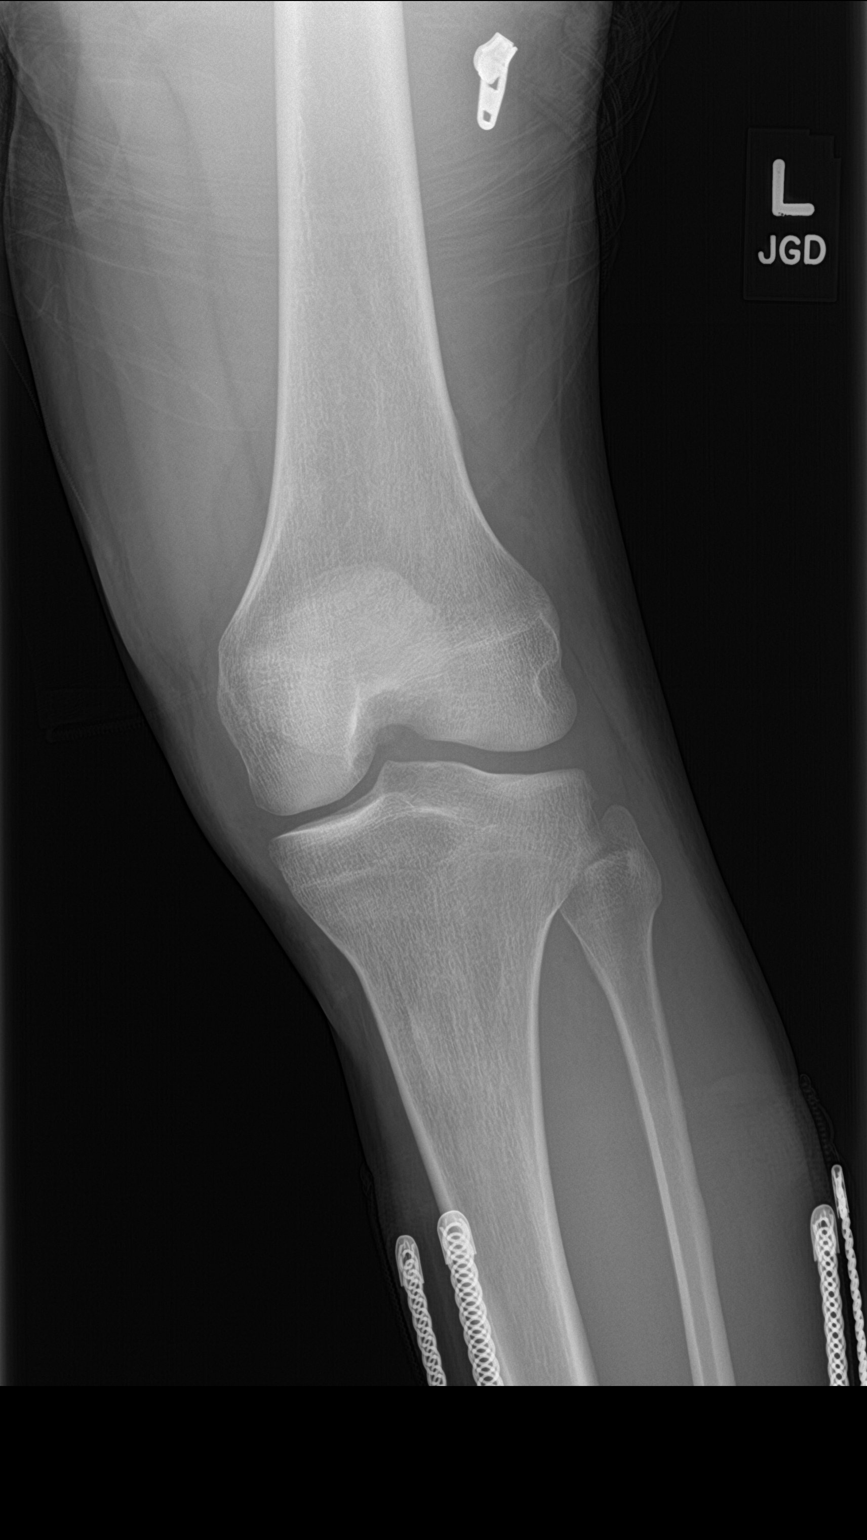

[knee lat]
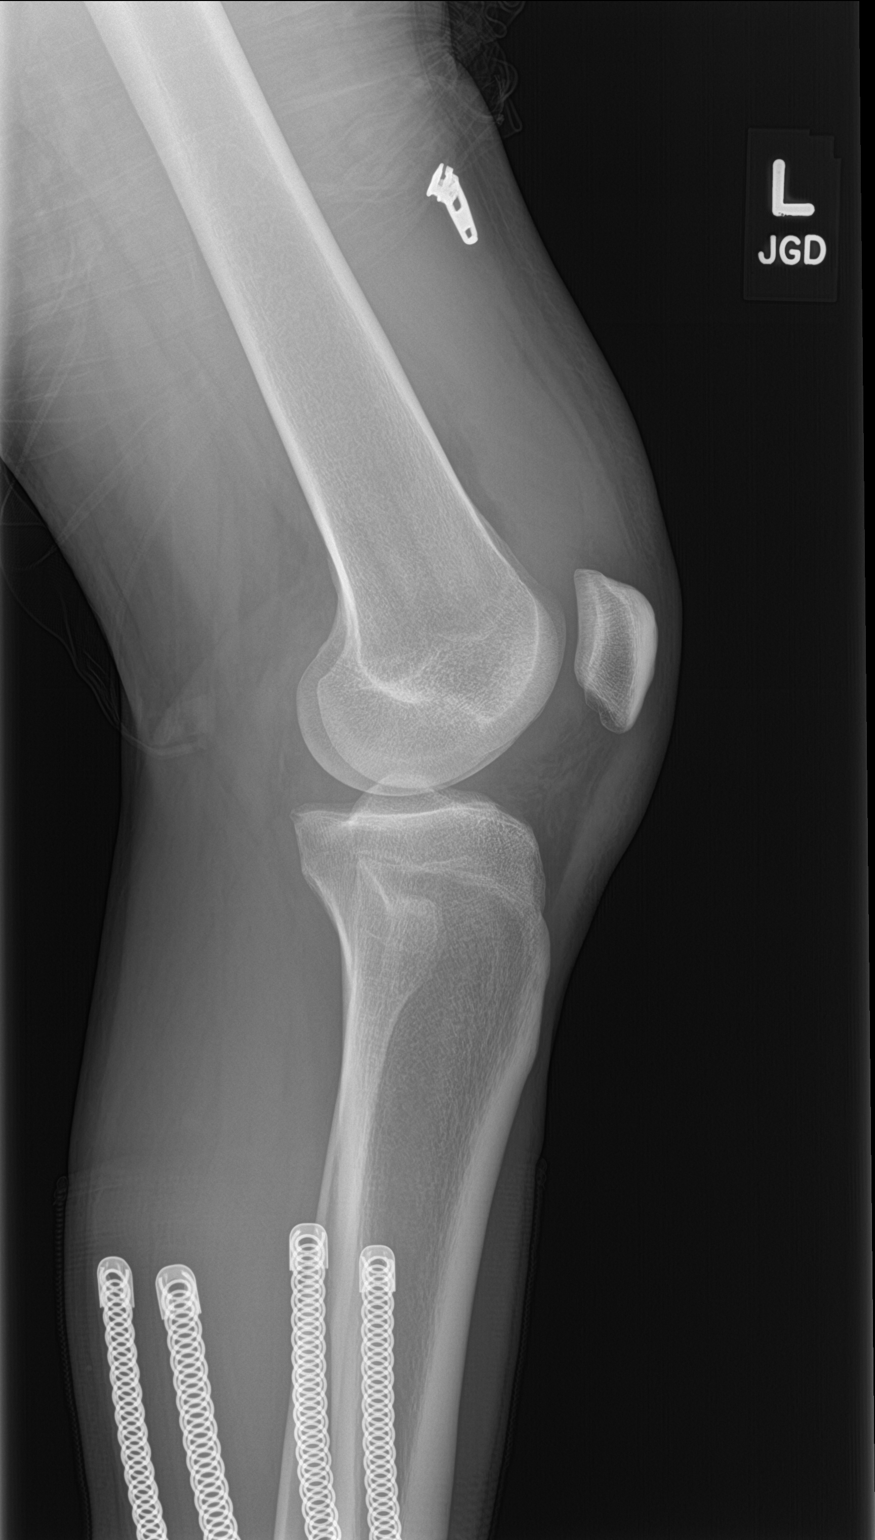

[4 of 4 positions shown; findings below may reference images not displayed]

FINDINGS: Four view study. No fracture or dislocation. Joint effusion evident.
No worrisome lytic or sclerotic osseous abnormality.
IMPRESSION: Joint effusion without acute bony findings.

## 2023-11-27 ENCOUNTER — Ambulatory Visit
Admission: RE | Admit: 2023-11-27 | Discharge: 2023-11-27 | Disposition: A | Discharge status: Home / Self Care | Source: Ambulatory Visit | Attending: Nurse Practitioner | Admitting: Nurse Practitioner

## 2023-11-27 VITALS — BP 127/76 | HR 73 | Temp 98.4°F | Resp 17

## 2023-11-27 DIAGNOSIS — J069 Acute upper respiratory infection, unspecified: Secondary | ICD-10-CM

## 2023-11-27 MED ORDER — BENZONATATE 100 MG PO CAPS: 100.0000 mg | ORAL_CAPSULE | Freq: Three times a day (TID) | ORAL | 0 refills | Status: AC | PRN

## 2023-11-27 MED ORDER — PROMETHAZINE-DM 6.25-15 MG/5ML PO SYRP: 5.0000 mL | ORAL_SOLUTION | Freq: Every evening | ORAL | 0 refills | Status: AC | PRN

## 2023-11-27 NOTE — Discharge Instructions (Signed)
 You have a viral upper respiratory infection.  Symptoms should improve over the next week to 10 days.  If you develop chest pain or shortness of breath, go to the emergency room.  Some things that can make you feel better are: - Increased rest - Increasing fluid with water/sugar free electrolytes - Acetaminophen  and ibuprofen  as needed for fever/pain - Salt water gargling, chloraseptic spray and throat lozenges - OTC guaifenesin (Mucinex) 600 mg twice daily for congestion - Saline sinus flushes or a neti pot - Humidifying the air -Tessalon  Perles every 8 hours as needed for dry cough and cough suppressant medication at night time as needed for dry cough

## 2023-11-27 NOTE — ED Provider Notes (Signed)
 [facility]    CSN: [id number] Arrival date & time: 11/27/23  0931      History   Chief Complaint Chief Complaint  Patient presents with   Cough    Fatigue, night sweats, cough, runny nose , sneezing - Entered by patient    HPI [name] [name] is a 33 y.o. male.   Patient presents today with 5 to 6-day history of bodyaches and chills, congested, productive cough, wheezing after cough, chest pain with coughing, runny nose, sneezing, headache from coughing, decreased appetite, and fatigue.  He denies known fevers, shortness of breath, chest pain at rest, stuffy nose, sore throat, ear pain, abdominal pain, nausea/vomiting, and diarrhea.  Has taken DayQuil and NyQuil for symptoms which seems to help minimally.  Reports multiple coworkers sick with similar symptoms at work.  Patient reports he smokes marijuana 2-3 times daily sometimes in cigar form.  He used to vape but quit.    Past Medical History:  Diagnosis Date   Laceration of right forearm with tendon involvement 04/22/2014    There are no active problems to display for this patient.   Past Surgical History:  Procedure Laterality Date   NERVE REPAIR Right 05/04/2014   Procedure: NERVE REPAIR;  Surgeon: Brunilda Capra, MD;  Location: [facility];  Service: Orthopedics;  Laterality: Right;   NO PAST SURGERIES     WOUND EXPLORATION Right 05/04/2014   Procedure: RIGHT FOREARM WOUND EXPLORATION;  Surgeon: Brunilda Capra, MD;  Location: [facility];  Service: Orthopedics;  Laterality: Right;       Home Medications    Prior to Admission medications   Medication Sig Start Date End Date Taking? Authorizing Provider  benzonatate (TESSALON) 100 MG capsule Take 1 capsule (100 mg total) by mouth 3 (three) times daily as needed for cough. Do not take with alcohol or while operating or driving heavy machinery 10/09/79  Yes Wilhemena Harbour, NP  promethazine-dextromethorphan (PROMETHAZINE-DM)  6.25-15 MG/5ML syrup Take 5 mLs by mouth at bedtime as needed for cough. Do not take with alcohol or while driving or operating heavy machinery.  May cause drowsiness. 11/27/23  Yes Wilhemena Harbour, NP    Family History Family History  Problem Relation Age of Onset   Diabetes Mother    Diabetes Father     Social History Social History   Tobacco Use   Smoking status: Every Day    Current packs/day: 0.00    Types: Cigarettes   Smokeless tobacco: Never   Tobacco comments:    4 cig./day  Substance Use Topics   Alcohol use: Yes    Comment: occ   Drug use: No     Allergies   Shrimp [shellfish allergy] and Penicillins   Review of Systems Review of Systems Per HPI  Physical Exam Triage Vital Signs ED Triage Vitals  Encounter Vitals Group     BP 11/27/23 0947 127/76     Systolic BP Percentile --      Diastolic BP Percentile --      Pulse Rate 11/27/23 0947 73     Resp 11/27/23 0947 17     Temp 11/27/23 0947 98.4 F (36.9 C)     Temp Source 11/27/23 0947 Oral     SpO2 11/27/23 0947 96 %     Weight --      Height --      Head Circumference --      Peak Flow --      Pain  Score 11/27/23 0950 0     Pain Loc --      Pain Education --      Exclude from Growth Chart --    No data found.  Updated Vital Signs BP 127/76 (BP Location: Right Arm)   Pulse 73   Temp 98.4 F (36.9 C) (Oral)   Resp 17   SpO2 96%   Visual Acuity Right Eye Distance:   Left Eye Distance:   Bilateral Distance:    Right Eye Near:   Left Eye Near:    Bilateral Near:     Physical Exam Vitals and nursing note reviewed.  Constitutional:      General: He is not in acute distress.    Appearance: Normal appearance. He is not ill-appearing or toxic-appearing.  HENT:     Head: Normocephalic and atraumatic.     Right Ear: Tympanic membrane, ear canal and external ear normal.     Left Ear: Tympanic membrane, ear canal and external ear normal.     Nose: No congestion or rhinorrhea.      Mouth/Throat:     Mouth: Mucous membranes are moist.     Pharynx: Oropharynx is clear. No oropharyngeal exudate or posterior oropharyngeal erythema.  Eyes:     General: No scleral icterus.    Extraocular Movements: Extraocular movements intact.  Cardiovascular:     Rate and Rhythm: Normal rate and regular rhythm.  Pulmonary:     Effort: Pulmonary effort is normal. No respiratory distress.     Breath sounds: Normal breath sounds. No wheezing, rhonchi or rales.  Musculoskeletal:     Cervical back: Normal range of motion and neck supple.  Lymphadenopathy:     Cervical: No cervical adenopathy.  Skin:    General: Skin is warm and dry.     Coloration: Skin is not jaundiced or pale.     Findings: No erythema or rash.  Neurological:     Mental Status: He is alert and oriented to person, place, and time.  Psychiatric:        Behavior: Behavior is cooperative.      UC Treatments / Results  Labs (all labs ordered are listed, but only abnormal results are displayed) Labs Reviewed - No data to display  EKG   Radiology No results found.  Procedures Procedures (including critical care time)  Medications Ordered in UC Medications - No data to display  Initial Impression / Assessment and Plan / UC Course  I have reviewed the triage vital signs and the nursing notes.  Pertinent labs & imaging results that were available during my care of the patient were reviewed by me and considered in my medical decision making (see chart for details).   Patient is well-appearing, normotensive, afebrile, not tachycardic, not tachypneic, oxygenating well on room air.   1. Viral URI with cough Vitals and exam are reassuring today in triage Suspect viral etiology Viral testing deferred given length of symptoms Supportive care discussed with patient, start guaifenesin and cough suppressant medication ER and return precautions discussed with patient Work excuse provided  The patient was given  the opportunity to ask questions.  All questions answered to their satisfaction.  The patient is in agreement to this plan.   Final Clinical Impressions(s) / UC Diagnoses   Final diagnoses:  Viral URI with cough     Discharge Instructions      You have a viral upper respiratory infection.  Symptoms should improve over the next week to 10 days.  If you develop chest pain or shortness of breath, go to the emergency room.  Some things that can make you feel better are: - Increased rest - Increasing fluid with water/sugar free electrolytes - Acetaminophen  and ibuprofen  as needed for fever/pain - Salt water gargling, chloraseptic spray and throat lozenges - OTC guaifenesin (Mucinex) 600 mg twice daily for congestion - Saline sinus flushes or a neti pot - Humidifying the air -Tessalon Perles every 8 hours as needed for dry cough and cough suppressant medication at night time as needed for dry cough   ED Prescriptions     Medication Sig Dispense Auth. Provider   benzonatate (TESSALON) 100 MG capsule Take 1 capsule (100 mg total) by mouth 3 (three) times daily as needed for cough. Do not take with alcohol or while operating or driving heavy machinery 30 capsule Thena Fireman A, NP   promethazine-dextromethorphan (PROMETHAZINE-DM) 6.25-15 MG/5ML syrup Take 5 mLs by mouth at bedtime as needed for cough. Do not take with alcohol or while driving or operating heavy machinery.  May cause drowsiness. 118 mL Wilhemena Harbour, NP      PDMP not reviewed this encounter.   Wilhemena Harbour, NP 11/27/23 1019

## 2023-11-27 NOTE — ED Triage Notes (Addendum)
 Pt c/o fatigue, night sweats, cough, runny nose, sneezing, and congestion since Thursday. He has been taking Nyquil and Dayquil

## 2023-11-29 ENCOUNTER — Encounter: Payer: Self-pay | Admitting: Family Medicine

## 2023-11-29 ENCOUNTER — Ambulatory Visit: Payer: Self-pay | Admitting: Family Medicine

## 2023-11-29 ENCOUNTER — Ambulatory Visit (INDEPENDENT_AMBULATORY_CARE_PROVIDER_SITE_OTHER): Admitting: Family Medicine

## 2023-11-29 VITALS — BP 134/82 | HR 81 | Temp 98.1°F | Ht 71.0 in | Wt 180.0 lb

## 2023-11-29 DIAGNOSIS — Z113 Encounter for screening for infections with a predominantly sexual mode of transmission: Secondary | ICD-10-CM

## 2023-11-29 DIAGNOSIS — R5383 Other fatigue: Secondary | ICD-10-CM | POA: Diagnosis not present

## 2023-11-29 DIAGNOSIS — E663 Overweight: Secondary | ICD-10-CM | POA: Diagnosis not present

## 2023-11-29 DIAGNOSIS — E559 Vitamin D deficiency, unspecified: Secondary | ICD-10-CM

## 2023-11-29 DIAGNOSIS — R1033 Periumbilical pain: Secondary | ICD-10-CM

## 2023-11-29 DIAGNOSIS — R062 Wheezing: Secondary | ICD-10-CM

## 2023-11-29 DIAGNOSIS — E785 Hyperlipidemia, unspecified: Secondary | ICD-10-CM

## 2023-11-29 DIAGNOSIS — R140 Abdominal distension (gaseous): Secondary | ICD-10-CM

## 2023-11-29 DIAGNOSIS — R7303 Prediabetes: Secondary | ICD-10-CM

## 2023-11-29 DIAGNOSIS — Z114 Encounter for screening for human immunodeficiency virus [HIV]: Secondary | ICD-10-CM | POA: Diagnosis not present

## 2023-11-29 DIAGNOSIS — K5900 Constipation, unspecified: Secondary | ICD-10-CM

## 2023-11-29 DIAGNOSIS — Z0000 Encounter for general adult medical examination without abnormal findings: Secondary | ICD-10-CM

## 2023-11-29 DIAGNOSIS — Z1159 Encounter for screening for other viral diseases: Secondary | ICD-10-CM

## 2023-11-29 LAB — CBC WITH DIFFERENTIAL/PLATELET
Basophils Absolute: 0 10*3/uL (ref 0.0–0.1)
Basophils Relative: 0.7 % (ref 0.0–3.0)
Eosinophils Absolute: 0.6 10*3/uL (ref 0.0–0.7)
Eosinophils Relative: 11.1 % — ABNORMAL HIGH (ref 0.0–5.0)
HCT: 43.8 % (ref 39.0–52.0)
Hemoglobin: 14.4 g/dL (ref 13.0–17.0)
Lymphocytes Relative: 35.7 % (ref 12.0–46.0)
Lymphs Abs: 2 10*3/uL (ref 0.7–4.0)
MCHC: 32.8 g/dL (ref 30.0–36.0)
MCV: 79.6 fl (ref 78.0–100.0)
Monocytes Absolute: 0.4 10*3/uL (ref 0.1–1.0)
Monocytes Relative: 6.8 % (ref 3.0–12.0)
Neutro Abs: 2.6 10*3/uL (ref 1.4–7.7)
Neutrophils Relative %: 45.7 % (ref 43.0–77.0)
Platelets: 225 10*3/uL (ref 150.0–400.0)
RBC: 5.5 Mil/uL (ref 4.22–5.81)
RDW: 13.9 % (ref 11.5–15.5)
WBC: 5.6 10*3/uL (ref 4.0–10.5)

## 2023-11-29 LAB — LIPID PANEL
Cholesterol: 214 mg/dL — ABNORMAL HIGH (ref 0–200)
HDL: 35.2 mg/dL — ABNORMAL LOW (ref >39.00)
LDL Cholesterol: 100 mg/dL — ABNORMAL HIGH (ref 0–99)
NonHDL: 178.93
Total CHOL/HDL Ratio: 6
Triglycerides: 396 mg/dL — ABNORMAL HIGH (ref 0.0–149.0)
VLDL: 79.2 mg/dL — ABNORMAL HIGH (ref 0.0–40.0)

## 2023-11-29 LAB — COMPREHENSIVE METABOLIC PANEL WITH GFR
ALT: 36 U/L (ref 0–53)
AST: 24 U/L (ref 0–37)
Albumin: 4.5 g/dL (ref 3.5–5.2)
Alkaline Phosphatase: 63 U/L (ref 39–117)
BUN: 13 mg/dL (ref 6–23)
CO2: 27 meq/L (ref 19–32)
Calcium: 9.6 mg/dL (ref 8.4–10.5)
Chloride: 103 meq/L (ref 96–112)
Creatinine, Ser: 1.2 mg/dL (ref 0.40–1.50)
GFR: 79.41 mL/min (ref >60.00)
Glucose, Bld: 66 mg/dL — ABNORMAL LOW (ref 70–99)
Potassium: 3.8 meq/L (ref 3.5–5.1)
Sodium: 139 meq/L (ref 135–145)
Total Bilirubin: 0.3 mg/dL (ref 0.2–1.2)
Total Protein: 8 g/dL (ref 6.0–8.3)

## 2023-11-29 LAB — TSH: TSH: 1.57 u[IU]/mL (ref 0.35–5.50)

## 2023-11-29 LAB — VITAMIN D 25 HYDROXY (VIT D DEFICIENCY, FRACTURES): VITD: 12.02 ng/mL — ABNORMAL LOW (ref 30.00–100.00)

## 2023-11-29 LAB — LIPASE: Lipase: 52 U/L (ref 11.0–59.0)

## 2023-11-29 LAB — VITAMIN B12: Vitamin B-12: 663 pg/mL (ref 211–911)

## 2023-11-29 LAB — HEMOGLOBIN A1C: Hgb A1c MFr Bld: 6.3 % (ref 4.6–6.5)

## 2023-11-29 MED ORDER — ALBUTEROL SULFATE HFA 108 (90 BASE) MCG/ACT IN AERS: 1.0000 | INHALATION_SPRAY | Freq: Four times a day (QID) | RESPIRATORY_TRACT | 2 refills | Status: AC | PRN

## 2023-11-29 NOTE — Progress Notes (Signed)
 New Patient Office Visit  Subjective    Patient ID: [name] [name], male    DOB: Mar 06, 1991  Age: 32 y.o. MRN: [id number]  CC:  Chief Complaint  Patient presents with   Establish Care    HPI [name] [name] presents to establish care  Previous provider- [facility]- Dr. Jolee Naval  Specialist- sees no specialist   Pt complaints of fatigue for the last couple months. Patient does not remember if there was a certain event that made him have increased fatigue. Nothing makes his fatigue worse. Exercise makes him feel better. Patient states that his body feels heavy, and he is tired even when he gets a full 8-9 hours of sleep. Denies shortness of breath, and chest pain.   Abdominal pain/bloating- Chronic. Patient states that he has intermittent and random abdominal pain and bloating that occurs mainly after eating a meal. Patient states that he has bowel movements twice a day. Denies diarrhea, nausea or vomiting.  Wheezing- Patient was seen at urgent care 11/27/23 where he was diagnosed with an upper respiratory infection and given tessalon pearles and premethazine cough syrup. Patient is still experiencing symptoms.   Health maintenance  Covid- declines  Hepatitis C screening and HIV lab screening ordered Outpatient Encounter Medications as of 11/29/2023  Medication Sig   albuterol (VENTOLIN HFA) 108 (90 Base) MCG/ACT inhaler Inhale 1-2 puffs into the lungs every 6 (six) hours as needed for wheezing or shortness of breath (Cough).   benzonatate (TESSALON) 100 MG capsule Take 1 capsule (100 mg total) by mouth 3 (three) times daily as needed for cough. Do not take with alcohol or while operating or driving heavy machinery   promethazine-dextromethorphan (PROMETHAZINE-DM) 6.25-15 MG/5ML syrup Take 5 mLs by mouth at bedtime as needed for cough. Do not take with alcohol or while driving or operating heavy machinery.  May cause drowsiness.   No facility-administered encounter medications  on file as of 11/29/2023.    Past Medical History:  Diagnosis Date   Anxiety    Laceration of right forearm with tendon involvement 04/22/2014    Past Surgical History:  Procedure Laterality Date   ANTERIOR CRUCIATE LIGAMENT REPAIR Right    MEDIAL COLLATERAL LIGAMENT AND LATERAL COLLATERAL LIGAMENT REPAIR, ELBOW Right    NERVE REPAIR Right 05/04/2014   Procedure: NERVE REPAIR;  Surgeon: Brunilda Capra, MD;  Location: [facility];  Service: Orthopedics;  Laterality: Right;   NO PAST SURGERIES     WOUND EXPLORATION Right 05/04/2014   Procedure: RIGHT FOREARM WOUND EXPLORATION;  Surgeon: Brunilda Capra, MD;  Location: [facility];  Service: Orthopedics;  Laterality: Right;    Family History  Problem Relation Age of Onset   Diabetes Mother    Diabetes Father    Cancer Maternal Grandmother     Social History   Socioeconomic History   Marital status: Single    Spouse name: Not on file   Number of children: Not on file   Years of education: Not on file   Highest education level: Not on file  Occupational History   Not on file  Tobacco Use   Smoking status: Former    Current packs/day: 0.00    Types: Cigarettes   Smokeless tobacco: Never   Tobacco comments:    4 cig./day  Vaping Use   Vaping status: Former  Substance and Sexual Activity   Alcohol use: Yes    Comment: occ beer   Drug use: Yes    Types: Marijuana  Sexual activity: Yes  Other Topics Concern   Not on file  Social History Narrative   Not on file   Social Drivers of Health   Financial Resource Strain: Low Risk  (11/29/2023)   Overall Financial Resource Strain (CARDIA)    Difficulty of Paying Living Expenses: Not hard at all  Food Insecurity: No Food Insecurity (11/29/2023)   Hunger Vital Sign    Worried About Running Out of Food in the Last Year: Never true    Ran Out of Food in the Last Year: Never true  Transportation Needs: No Transportation Needs (11/29/2023)   PRAPARE -  Administrator, Civil Service (Medical): No    Lack of Transportation (Non-Medical): No  Physical Activity: Sufficiently Active (11/29/2023)   Exercise Vital Sign    Days of Exercise per Week: 7 days    Minutes of Exercise per Session: 40 min  Stress: No Stress Concern Present (11/29/2023)   [employer] of Occupational Health - Occupational Stress Questionnaire    Feeling of Stress : Only a little  Social Connections: Moderately Integrated (11/29/2023)   Social Connection and Isolation Panel [NHANES]    Frequency of Communication with Friends and Family: More than three times a week    Frequency of Social Gatherings with Friends and Family: Twice a week    Attends Religious Services: 1 to 4 times per year    Active Member of [employer] or Organizations: No    Attends Banker Meetings: Never    Marital Status: Married  Catering manager Violence: Not At Risk (11/29/2023)   Humiliation, Afraid, Rape, and Kick questionnaire    Fear of Current or Ex-Partner: No    Emotionally Abused: No    Physically Abused: No    Sexually Abused: No    Review of Systems  Constitutional:  Negative for fever.  HENT:  Positive for congestion. Negative for sore throat.   Eyes:  Negative for blurred vision.  Respiratory:  Positive for cough and sputum production (yellow sputum). Negative for shortness of breath.   Cardiovascular:  Negative for chest pain and palpitations.  Gastrointestinal:  Positive for abdominal pain (occ). Negative for constipation, diarrhea, heartburn, nausea and vomiting.  Neurological:  Positive for headaches (occ). Negative for dizziness.        Objective    BP 134/82   Pulse 81   Temp 98.1 F (36.7 C) (Oral)   Ht 5\' 11"  (1.803 m)   Wt 180 lb (81.6 kg)   SpO2 96%   BMI 25.10 kg/m   Physical Exam Constitutional:      Appearance: Normal appearance.  HENT:     Head: Normocephalic and atraumatic.     Nose: Congestion present.  Eyes:      Extraocular Movements: Extraocular movements intact.     Pupils: Pupils are equal, round, and reactive to light.  Cardiovascular:     Rate and Rhythm: Normal rate and regular rhythm.     Pulses: Normal pulses.     Heart sounds: Normal heart sounds.  Pulmonary:     Effort: Pulmonary effort is normal.     Breath sounds: Wheezing (expiratory wheezes) present.  Abdominal:     General: Abdomen is flat. Bowel sounds are normal. There is no distension.     Palpations: Abdomen is soft.     Tenderness: There is abdominal tenderness. There is no guarding.  Musculoskeletal:     Cervical back: Normal range of motion and neck supple.  Skin:  General: Skin is warm.     Capillary Refill: Capillary refill takes less than 2 seconds.  Neurological:     Mental Status: He is alert and oriented to person, place, and time.  Psychiatric:        Mood and Affect: Mood normal.        Behavior: Behavior normal.       Assessment & Plan:   Problem List Items Addressed This Visit   None Visit Diagnoses       Fatigue, unspecified type    -  Primary   Relevant Orders   TSH   Vitamin D, 25-hydroxy   Vitamin B12   CBC with Differential/Platelets   Iron, TIBC and Ferritin Panel   CMP     Encounter for medical examination to establish care       Relevant Orders   HIV antibody (with reflex)   Hep C Antibody     Screening examination for STD (sexually transmitted disease)       Relevant Orders   HIV antibody (with reflex)   Hep C Antibody     Periumbilical abdominal pain       Relevant Orders   CBC with Differential/Platelets   CMP   Lipase   CT ABDOMEN PELVIS WO CONTRAST     Encounter for screening for HIV       Relevant Orders   HIV antibody (with reflex)     Need for hepatitis C screening test       Relevant Orders   Hep C Antibody     Overweight (BMI 25.0-29.9)       Relevant Orders   Lipid Panel   Hemoglobin A1c     Wheezing       Relevant Medications   albuterol (VENTOLIN HFA)  108 (90 Base) MCG/ACT inhaler     Bloating       Relevant Orders   CT ABDOMEN PELVIS WO CONTRAST     Constipation, unspecified constipation type       Relevant Orders   CT ABDOMEN PELVIS WO CONTRAST      Fatigue - Labs ordered- TSH, CBC, CMP, vitamin B12, vitamin D, and iron panel.   Abdominal pain/bloating/ constipation - CT of abdomen ordered. - Labs ordered- lipase level, CBC, and CMP.   Wheezing -Albuterol inhaler prescription sent to preferred pharmacy.  -If there is no improvement within a week, recommend follow-up visit.   Overweight - Labs ordered- A1C, and lipid panel.   HIV and Hep C screening - Labs ordered- Hepatitis C and HIV    Return in about 6 months (around 05/31/2024) for physical.   JoAnna Williamson, NP

## 2023-11-29 NOTE — Addendum Note (Signed)
 Addended by: Naiya Corral R on: 11/29/2023 04:20 PM   Modules accepted: Orders

## 2023-11-29 NOTE — Patient Instructions (Signed)
 It was a pleasure seeing you today!  - Labs ordered- thyroid function, white blood cell, red blood cell, kidney and liver function, vitamin B12, vitamin D, and iron panel for the fatigue.  - CT of abdomen ordered for abdominal pain/bloating and constipation. - Labs ordered- lipase level for abdominal pain/bloating and constipation. -Albuterol inhaler prescription sent to preferred pharmacy for wheezing.  -If there is no improvement within a week, recommend follow-up visit.  - Labs ordered- A1C, and lipid panel to check sugar levels and cholesterol levels.  - Labs ordered- Hepatitis C and HIV for a one time screening for health maintenance. Return in about 6 months (around 05/31/2024) for physical.

## 2023-11-30 LAB — IRON,TIBC AND FERRITIN PANEL
%SAT: 27 % (ref 20–48)
Ferritin: 153 ng/mL (ref 38–380)
Iron: 87 ug/dL (ref 50–180)
TIBC: 323 ug/dL (ref 250–425)

## 2023-11-30 LAB — HIV ANTIBODY (ROUTINE TESTING W REFLEX): HIV 1&2 Ab, 4th Generation: NONREACTIVE

## 2023-11-30 LAB — HEPATITIS C ANTIBODY: Hepatitis C Ab: NONREACTIVE

## 2023-12-02 MED ORDER — VITAMIN D3 1.25 MG (50000 UT) PO CAPS: 1.2500 mg | ORAL_CAPSULE | ORAL | 0 refills | Status: AC

## 2023-12-02 NOTE — Addendum Note (Signed)
 Addended by: Teodoro Feller B on: 12/02/2023 08:29 AM   Modules accepted: Orders

## 2023-12-02 NOTE — Addendum Note (Signed)
 Addended by: Teodoro Feller B on: 12/02/2023 01:43 PM   Modules accepted: Orders

## 2024-03-06 ENCOUNTER — Other Ambulatory Visit
